# Patient Record
Sex: Female | Born: 1975 | Race: White | Hispanic: No | Marital: Married | Smoking: Former smoker
Health system: Southern US, Academic
[De-identification: ages and names within clinical notes are randomized; demographics above are authoritative.]

## PROBLEM LIST (undated history)

## (undated) DIAGNOSIS — E063 Autoimmune thyroiditis: Secondary | ICD-10-CM

## (undated) DIAGNOSIS — Z973 Presence of spectacles and contact lenses: Secondary | ICD-10-CM

## (undated) DIAGNOSIS — G35 Multiple sclerosis: Secondary | ICD-10-CM

## (undated) HISTORY — DX: Multiple sclerosis (CMS HCC): G35

## (undated) HISTORY — PX: HX WISDOM TEETH EXTRACTION: SHX21

## (undated) HISTORY — DX: Autoimmune thyroiditis: E06.3

---

## 2010-09-22 ENCOUNTER — Ambulatory Visit (INDEPENDENT_AMBULATORY_CARE_PROVIDER_SITE_OTHER): Payer: BC Managed Care – PPO | Admitting: Endocrinology, Diabetes, & Metabolism

## 2010-12-24 ENCOUNTER — Encounter (INDEPENDENT_AMBULATORY_CARE_PROVIDER_SITE_OTHER): Payer: BC Managed Care – PPO | Admitting: Endocrinology, Diabetes, & Metabolism

## 2012-01-05 ENCOUNTER — Other Ambulatory Visit (INDEPENDENT_AMBULATORY_CARE_PROVIDER_SITE_OTHER): Payer: Self-pay | Admitting: Endocrinology, Diabetes, & Metabolism

## 2012-01-06 MED ORDER — SYNTHROID 100 MCG TABLET
100.0000 ug | ORAL_TABLET | Freq: Every day | ORAL | Status: DC
Start: 2012-01-05 — End: 2013-02-15

## 2013-02-04 ENCOUNTER — Other Ambulatory Visit (INDEPENDENT_AMBULATORY_CARE_PROVIDER_SITE_OTHER): Payer: Self-pay | Admitting: Endocrinology, Diabetes, & Metabolism

## 2013-02-05 NOTE — Telephone Encounter (Signed)
Terri:    I haven't seen Kayte since May 2012 and feel that we at least need thyroid function studies that are more recent than those available. Has she had any tests done through her primary care physician? If so, we'll need copies before committing to a year long prescription. An alternative would be for her PCP to prescribe the medication if there is available data - otherwise, I would extend a 30 day prescription and obtain the appropriate studies. Let me know>      SRG

## 2013-02-15 ENCOUNTER — Other Ambulatory Visit (INDEPENDENT_AMBULATORY_CARE_PROVIDER_SITE_OTHER): Payer: Self-pay | Admitting: Endocrinology, Diabetes, & Metabolism

## 2013-02-15 MED ORDER — SYNTHROID 100 MCG TABLET
100.0000 ug | ORAL_TABLET | Freq: Every day | ORAL | Status: DC
Start: 2013-02-15 — End: 2013-03-17

## 2013-02-23 ENCOUNTER — Ambulatory Visit (INDEPENDENT_AMBULATORY_CARE_PROVIDER_SITE_OTHER): Payer: BC Managed Care – PPO | Admitting: Endocrinology, Diabetes, & Metabolism

## 2013-02-23 ENCOUNTER — Encounter (INDEPENDENT_AMBULATORY_CARE_PROVIDER_SITE_OTHER): Payer: Self-pay | Admitting: Endocrinology, Diabetes, & Metabolism

## 2013-02-23 VITALS — BP 118/76 | HR 70 | Ht 66.0 in | Wt 144.6 lb

## 2013-02-23 LAB — THYROID STIMULATING HORMONE (SENSITIVE TSH): TSH: 3.4 u[IU]/mL (ref 0.350–5.550)

## 2013-02-23 LAB — THYROXINE, FREE (FREE T4): THYROXINE, FREE (FREE T4): 1.2 ng/dL (ref 0.9–1.8)

## 2013-02-23 NOTE — Progress Notes (Signed)
1. 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 At that time, TPO antibodies were quite positive and Synthroid maintenance was reduced from 112 mcg to 100 mcg daily when TSH was reported as 0.172 uIU/ml with a free thyroxine of 1.1 ng/dl. There have been no additional studies since that time. Over the ensuing two years she has noted a gradual weight reduction of approximately 10 lbs but no signs of hyper- or hypo- thyroidism. 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     Examination today revealed thyroid enlargement without distinct nodularity. There was no tenderness or adenopathy. DTR's equal, +2 with normal relaxation phase. We'll check TSH and free thyroxine with likely maintenance of 100 mcg Synthroid daily. In addition, a thyroid ultrasound will be scheduled with Dr. Atilano Median on return in 6 months.    2. Multiple sclerosis, previously followed by Dr, Sharlett Iles:    When last seen, Lindsey Kirby was doing well on Nuvigil 150 mg daily per Dr. Jonny Ruiz but the prescription has since "lapsed" following Dr. Raphael Gibney departure for the [facility]. A referral to resume Neurologic with Dr. Alroy Bailiff was provided.

## 2013-03-17 ENCOUNTER — Other Ambulatory Visit (INDEPENDENT_AMBULATORY_CARE_PROVIDER_SITE_OTHER): Payer: Self-pay | Admitting: Endocrinology, Diabetes, & Metabolism

## 2013-04-26 ENCOUNTER — Other Ambulatory Visit (INDEPENDENT_AMBULATORY_CARE_PROVIDER_SITE_OTHER): Payer: Self-pay | Admitting: Endocrinology, Diabetes, & Metabolism

## 2013-08-24 ENCOUNTER — Encounter (INDEPENDENT_AMBULATORY_CARE_PROVIDER_SITE_OTHER): Payer: BC Managed Care – PPO | Admitting: Endocrinology, Diabetes, & Metabolism

## 2013-08-27 ENCOUNTER — Ambulatory Visit (INDEPENDENT_AMBULATORY_CARE_PROVIDER_SITE_OTHER): Payer: BC Managed Care – PPO | Admitting: Endocrinology, Diabetes, & Metabolism

## 2013-08-27 ENCOUNTER — Encounter (INDEPENDENT_AMBULATORY_CARE_PROVIDER_SITE_OTHER): Payer: Self-pay

## 2013-08-27 ENCOUNTER — Ambulatory Visit (INDEPENDENT_AMBULATORY_CARE_PROVIDER_SITE_OTHER): Payer: BC Managed Care – PPO

## 2013-08-27 ENCOUNTER — Encounter (INDEPENDENT_AMBULATORY_CARE_PROVIDER_SITE_OTHER): Payer: Self-pay | Admitting: Endocrinology, Diabetes, & Metabolism

## 2013-08-27 VITALS — BP 136/80 | HR 79 | Ht 66.0 in | Wt 154.2 lb

## 2013-08-27 DIAGNOSIS — E039 Hypothyroidism, unspecified: Secondary | ICD-10-CM

## 2013-08-27 DIAGNOSIS — E041 Nontoxic single thyroid nodule: Secondary | ICD-10-CM

## 2013-08-27 NOTE — Progress Notes (Signed)
1. 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 She continues maintenance on the reduced dosaage of Synthroid at 100 mcg daily. TPO antibodies were quite positive with established Hashimoto's thyroiditis confirmed. 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   Examination today revealed thyroid enlargement without distinct nodularity. There was no tenderness or adenopathy. DTR's equal, +2 with normal relaxation phase. We'll check TSH and free thyroxine with likely maintenance of 100 mcg Synthroid daily. In addition, a thyroid ultrasound obtained through Dr. Atilano Median was considered compatible with "Hashimoto's".   2. Multiple sclerosis, previously followed by Dr, Sharlett Iles:   When last seen, Luceille was doing well on Nuvigil 150 mg daily per Dr. Jonny Ruiz but the prescription has since "lapsed" following Dr. Raphael Gibney departure for the [facility]. A referral to resume Neurologic follow-up with Dr. Alroy Bailiff was provided previously.  3. Candida:  Kaycee is concerned re: systemic candidal infections as contributing persistent GI problems with upper abdominal pain and a recent normal colonoscopy. Her concerns can best be answered by an ID referral with several names provided for her PCP to consider.

## 2013-09-03 NOTE — Progress Notes (Unsigned)
[facility], Department of Medicine and Specialty Office  [address] [place]  [place], [place] [address]  [phone]    PROGRESS NOTE    PATIENT NAME: Lindsey Kirby, Lindsey Kirby  CHART NUMBER: [id number]  DATE OF BIRTH: 07-Jul-1976  DATE OF SERVICE: 08/27/2013    SUBJECTIVE:  This is a patient with positive TPO.  The patient's thyroid was imaged in the upper, mid and lower portion with transverse cuts.  Longitudinal cuts were made as well.  Right lobe is 1.29 x 1.49 x 4.53 for a volume of 4.55 cm3.  Left lobe is 1.38 x 1.06 x 3.46 x 4.11 for a volume of 3.14 cm3.  The isthmus is 0.372 cm.  The thyroid has a classic appearance of thyroiditis with patchy uptake.  No specific nodules are noted.    IMPRESSION:  Probable thyroiditis.      PLAN:  The patient should be reevaluated in one year.  Stored in [place].      Katherene Ponto, MD  Professor, Department of Internal Medicine  [facility] [place] [facility]    NO/MV/[id number]; D: 09/03/2013 13:59:10; T: 09/03/2013 17:00:35

## 2014-04-15 ENCOUNTER — Other Ambulatory Visit (INDEPENDENT_AMBULATORY_CARE_PROVIDER_SITE_OTHER): Payer: Self-pay | Admitting: Endocrinology, Diabetes, & Metabolism

## 2014-04-15 DIAGNOSIS — E039 Hypothyroidism, unspecified: Secondary | ICD-10-CM

## 2014-04-15 DIAGNOSIS — E034 Atrophy of thyroid (acquired): Secondary | ICD-10-CM

## 2014-04-15 MED ORDER — SYNTHROID 100 MCG TABLET
ORAL_TABLET | ORAL | Status: DC
Start: 2014-04-15 — End: 2015-04-21

## 2014-07-29 NOTE — Care Management Notes (Signed)
[facility] INTAKE    Insurance Information:  Referring Provider and Contact Number: Dr. Adelene Amas. Melvyn Neth,  [phone]  Transfer Source and Contact Number: [place],   [phone], [phone]  Transfer Emergent:   no    Date Admitted:  Diagnosis: MS  Type of bed pt currently in at sending facility: ER  PMH:  Past Medical History   Diagnosis Date    MS (multiple sclerosis)     Hashimoto's thyroiditis          Dialysis: no    Cancer: no     Isolation: no     Is patient established with family practice/attending? no  Reason for transfer:  For a higher level of care.    Patient story/clinical presentation: Hx of MS, vision is getting worse. Dr. Francisco Capuchin spoke with Dr. Melvyn Neth and accepted pt.    Current vital signs:  HR: 61   BP: 136/89   Resp: 18   Temp: 98      Sats:      O2:  Labs:    WBC (3.6-11)    HGB (13.1-17.3)    Hct (39.8-50.2)    PLC (140-400)    ABG ph    ABG PCO2    ABG PO2    ABG HCO3    Base def/exc    LP Glucose    LP Neutrophil    LP Protein    LP WBC    LP Other    PT    PTT    INR    CPK    Trop I    AST    ALT    Na (135-    K+ (3.5)    CL (96-1)    CO2 (23-35)    BUN (8-26)    Cr (0.62-1.27)    Glucose      Radiology (pelase place images on image grid)MRI on 07/19/14 - lesion in the cord, MS    EKG:  IV Access:  Medications, IVF, Drips:    Oxygen/Bipap/Vent settings:    Pt class and accommodation: inpatient,  floor  Service and accepting MD: Neurology 1,  Dr. Francisco Capuchin

## 2014-07-30 ENCOUNTER — Inpatient Hospital Stay
Admission: AD | Admit: 2014-07-30 | Discharge: 2014-08-01 | DRG: 059 | Disposition: A | Discharge status: Home / Self Care | Source: Other Acute Inpatient Hospital | Attending: Neurology | Admitting: Neurology

## 2014-07-30 ENCOUNTER — Encounter (HOSPITAL_COMMUNITY): Payer: Self-pay | Admitting: Neurology

## 2014-07-30 ENCOUNTER — Inpatient Hospital Stay (HOSPITAL_COMMUNITY)

## 2014-07-30 ENCOUNTER — Inpatient Hospital Stay (HOSPITAL_COMMUNITY): Payer: BC Managed Care – PPO | Admitting: Neurology

## 2014-07-30 DIAGNOSIS — E039 Hypothyroidism, unspecified: Secondary | ICD-10-CM | POA: Diagnosis present

## 2014-07-30 DIAGNOSIS — H469 Unspecified optic neuritis: Secondary | ICD-10-CM

## 2014-07-30 DIAGNOSIS — Z87891 Personal history of nicotine dependence: Secondary | ICD-10-CM

## 2014-07-30 DIAGNOSIS — G35 Multiple sclerosis: Secondary | ICD-10-CM

## 2014-07-30 DIAGNOSIS — E063 Autoimmune thyroiditis: Secondary | ICD-10-CM | POA: Diagnosis present

## 2014-07-30 DIAGNOSIS — H548 Legal blindness, as defined in USA: Secondary | ICD-10-CM | POA: Diagnosis present

## 2014-07-30 HISTORY — DX: Presence of spectacles and contact lenses: Z97.3

## 2014-07-30 LAB — CBC/DIFF
BASOPHILS: 0 %
BASOS ABS: 0.007 10*3/uL (ref 0.000–0.200)
EOS ABS: 0.007 10*3/uL (ref 0.000–0.500)
EOSINOPHIL: 0 %
HCT: 37.3 % (ref 33.5–45.2)
HGB: 12.4 g/dL (ref 11.2–15.2)
LYMPHOCYTES: 6 %
LYMPHS ABS: 0.834 10*3/uL — ABNORMAL LOW (ref 1.000–4.800)
MCH: 30.1 pg (ref 27.4–33.0)
MCHC: 33.1 g/dL (ref 32.5–35.8)
MCV: 91 fL (ref 78–100)
MONOCYTES: 6 %
MONOS ABS: 0.856 10*3/uL (ref 0.300–1.000)
MPV: 7.9 fL (ref 7.5–11.5)
PLATELET COUNT: 235 10*3/uL (ref 140–450)
PMN ABS: 11.973 10*3/uL — ABNORMAL HIGH (ref 1.500–7.700)
PMN'S: 88 %
RBC: 4.1 MIL/uL (ref 3.63–4.92)
RDW: 14.2 % (ref 12.0–15.0)
WBC: 13.7 10*3/uL — ABNORMAL HIGH (ref 3.5–11.0)

## 2014-07-30 LAB — BASIC METABOLIC PANEL
ANION GAP: 6 mmol/L (ref 4–13)
BUN/CREAT RATIO: 19 (ref 6–22)
BUN/CREAT RATIO: 19 (ref 6–22)
BUN: 14 mg/dL (ref 8–25)
CALCIUM: 8.4 mg/dL — ABNORMAL LOW (ref 8.5–10.4)
CARBON DIOXIDE: 26 mmol/L (ref 22–32)
CHLORIDE: 104 mmol/L (ref 96–111)
CREATININE: 0.72 mg/dL (ref 0.49–1.10)
ESTIMATED GLOMERULAR FILTRATION RATE: >59 ml/min/1.73m2 (ref >59)
GLUCOSE,NONFAST: 105 mg/dL (ref 65–139)
POTASSIUM: 4 mmol/L (ref 3.5–5.1)
SODIUM: 136 mmol/L (ref 136–145)

## 2014-07-30 LAB — PERFORM POC WHOLE BLOOD GLUCOSE
GLUCOSE, POINT OF CARE: 114 mg/dL — ABNORMAL HIGH (ref 70–105)
GLUCOSE, POINT OF CARE: 183 mg/dL — ABNORMAL HIGH (ref 70–105)
GLUCOSE, POINT OF CARE: 183 mg/dL — ABNORMAL HIGH (ref 70–105)
GLUCOSE, POINT OF CARE: 160 mg/dL — ABNORMAL HIGH (ref 70–105)

## 2014-07-30 LAB — URINALYSIS MACROSCOPIC WITH REFLEX TO MICROSCOPIC URINALYSIS (CULTURE NOT PERFORMED)
NITRITE: NEGATIVE
PH URINE: 7 (ref 5.0–8.0)
PROTEIN: 30 mg/dL — AB
SPECIFIC GRAVITY, URINE: <=1.005 (ref 1.005–1.030)
UROBILINOGEN: NEGATIVE mg/dL

## 2014-07-30 LAB — URINALYSIS, MICROSCOPIC
RBC'S: 38 /HPF — ABNORMAL HIGH (ref <6)
WBC'S: 6 /HPF (ref <11)

## 2014-07-30 LAB — PHOSPHORUS: PHOSPHORUS: 3.2 mg/dL (ref 2.4–4.7)

## 2014-07-30 LAB — URINALYSIS WITH MICROSCOPIC REFLEX IF INDICATED: LEUKOCYTES: NEGATIVE

## 2014-07-30 LAB — MAGNESIUM: MAGNESIUM: 2.4 mg/dL (ref 1.6–2.5)

## 2014-07-30 MED ORDER — ENOXAPARIN 40 MG/0.4 ML SUBCUTANEOUS SYRINGE
40.0000 mg | INJECTION | SUBCUTANEOUS | Status: DC
Start: 2014-07-30 — End: 2014-08-01
  Administered 2014-07-30 – 2014-07-31 (×2): 40 mg via SUBCUTANEOUS
  Administered 2014-08-01: 0 mg via SUBCUTANEOUS
  Filled 2014-07-30 (×2): qty 0
  Filled 2014-07-30: qty 0.4

## 2014-07-30 MED ORDER — LEVOTHYROXINE 100 MCG TABLET
100.00 ug | ORAL_TABLET | Freq: Every morning | ORAL | Status: DC
Start: 2014-07-30 — End: 2014-08-01
  Administered 2014-07-30 – 2014-08-01 (×3): 100 ug via ORAL
  Filled 2014-07-30 (×4): qty 1

## 2014-07-30 MED ORDER — GADOPENTETATE DIMEGLUMINE 10 MMOL/20 ML(469.01 MG/ML) INTRAVENOUS SOLN
12.00 mL | INTRAVENOUS | Status: AC
Start: 2014-07-30 — End: 2014-07-30
  Administered 2014-07-30: 06:00:00 12 mL via INTRAVENOUS

## 2014-07-30 MED ORDER — FOLIC ACID 1 MG TABLET
1.0000 mg | ORAL_TABLET | Freq: Every day | ORAL | Status: DC
Start: 2014-07-30 — End: 2014-08-01
  Administered 2014-07-30 – 2014-08-01 (×3): 1 mg via ORAL
  Filled 2014-07-30 (×3): qty 1

## 2014-07-30 MED ORDER — SODIUM CHLORIDE 0.9 % INTRAVENOUS SOLUTION
1000.00 mg | INTRAVENOUS | Status: AC
Start: 2014-07-31 — End: 2014-08-01
  Administered 2014-07-31: 1000 mg via INTRAVENOUS
  Administered 2014-07-31: 0 mg via INTRAVENOUS
  Administered 2014-08-01: 1000 mg via INTRAVENOUS
  Filled 2014-07-30 (×2): qty 25

## 2014-07-30 MED ORDER — INSULIN LISPRO 100 UNIT/ML SUB-Q SSIP
2.00 [IU] | INJECTION | Freq: Four times a day (QID) | SUBCUTANEOUS | Status: DC | PRN
Start: 2014-07-30 — End: 2014-08-01
  Administered 2014-07-30 (×2): 2 [IU] via SUBCUTANEOUS
  Filled 2014-07-30: qty 3

## 2014-07-30 MED ORDER — VITAMIN B COMPLEX TABLET
1.0000 | ORAL_TABLET | Freq: Every day | ORAL | Status: DC
Start: 2014-07-30 — End: 2014-08-01
  Administered 2014-07-30 – 2014-08-01 (×3): 1 via ORAL
  Filled 2014-07-30 (×3): qty 1

## 2014-07-30 MED ORDER — SODIUM CHLORIDE 0.9 % INTRAVENOUS SOLUTION
1000.00 mg | INTRAVENOUS | Status: DC
Start: 2014-07-30 — End: 2014-07-30
  Administered 2014-07-30: 1000 mg via INTRAVENOUS
  Filled 2014-07-30: qty 25

## 2014-07-30 MED ORDER — ASCORBIC ACID (VITAMIN C) 500 MG TABLET
1000.0000 mg | ORAL_TABLET | Freq: Every day | ORAL | Status: DC
Start: 2014-07-30 — End: 2014-08-01
  Administered 2014-07-30 – 2014-08-01 (×3): 1000 mg via ORAL
  Filled 2014-07-30 (×3): qty 2

## 2014-07-30 MED ORDER — FAMOTIDINE 20 MG TABLET
20.0000 mg | ORAL_TABLET | Freq: Two times a day (BID) | ORAL | Status: DC
Start: 2014-07-30 — End: 2014-08-01
  Administered 2014-07-30: 20 mg via ORAL
  Administered 2014-07-30: 0 mg via ORAL
  Administered 2014-07-30 – 2014-08-01 (×4): 20 mg via ORAL
  Filled 2014-07-30 (×7): qty 1

## 2014-07-30 MED ADMIN — erythromycin 5 mg/gram (0.5 %) eye ointment: ORAL | @ 08:00:00 | NDC 17478007031

## 2014-07-30 MED ADMIN — Medication: INTRAVENOUS | @ 02:00:00

## 2014-07-30 MED ADMIN — bacitracin zinc 500 unit/gram topical ointment: SUBCUTANEOUS | @ 21:00:00 | NDC 00168001135

## 2014-07-30 NOTE — H&P (Addendum)
[facility]   Neurology H&P      Lindsey Kirby, Lindsey Kirby, 38 y.o. female  Date of Admission:  07/30/2014  Date of Birth:  [birth year]    PCP: Lindsey Octave, MD    Information obtained from: patient and chart review   Chief Complaint:  Bilateral blurry vision, c/f multiple sclerosis exacerbation     Lindsey:VHQIO E Kirby is a 38 y.o., White female with PMH of Multiple Sclerosis and hypothyroidism who presents from outside facility due to bilateral blurry vision which has progressively become worse since end of August. Patient diagnosed in 2001 with multiple sclerosis in [place]. She presented at that time, with gait instability, headache, unilateral weakness/numbness(unsure what side). Imaging of brain and spine was done and revealed lesions. At that time she was started avonex which she took for 2 years. She has also been on capoxone, tysabri, and gileneya. In the past 10 years patient has only had a 5-6 multiple sclerosis exacerbation and has received IV steroids. Last exacerbation where she received steroids was 4 years. Patient reports blurry vision started in the right eye in the end of august. She was recently started to adderall at that time, and she thought it was just a s/e. But then she started to have blurry vision in left eye. So she weaned her self off the adderall. However, blurry vision didn't resolve and progressively becoming worse. Patient went to neurologist in December 4th, (only time she could get an appointment) who referred you optometrist. 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 The next day patient went to ER, patient was prescribed oral steroids for 4 days, which didn't help. Patient went back to ED, had imaging done (MRI brain and cervical spine) diagnosed with optic neuritis. Patient started on heavier dose of steroids. As the steroids were not helping she went back to the ED today, and was [facility]. Denied any unilateral numbness/weakness.  Denied diplopia.     Admission Source:  Home  Advance Directives:  None-Discussed  Hospice involvement prior to admission?  Not applicable    Location (of pain): Quality (character of pain) Severity (minimal, mild, severe, scale or 1-10) Duration (how long has pain/sx present) Timing (when does pain/sx occur)  Context (activity at/before onset) Modifying Factors (what makes pain/sx  Better/worse) Associate Sign/Sx (what accompanies main pain/sx)    Past Medical History   Diagnosis Date    MS (multiple sclerosis)     Hashimoto's thyroiditis          No past surgical history on file.      Medications Prior to Admission     Prescriptions    SYNTHROID 100 mcg Oral Tablet    TAKE 1 TABLET BY MOUTH EVERY DAY        No Known Allergies  History   Substance Use Topics    Smoking status: Former Smoker -- 0.50 packs/day for 12 years     Types: Cigarettes     Quit date: 02/24/2007    Smokeless tobacco: Not on file    Alcohol Use: Not on file     Family History   Problem Relation Age of Onset    Hypertension             ROS: Other than ROS in the HPI, all other systems were negative.    Exam:     General: acutely ill  HENT:Head atraumatic and normocephalic  Neck: No JVD or thyromegaly or lymphadenopathy  Carotids:Carotids normal without bruit  Lungs: Clear to auscultation bilaterally.   Cardiovascular: regular rate and rhythm  Abdomen: Soft, non-tender  Extremities: No cyanosis or edema  Ophthalomscopic: normal w/o hemorrhages, exudates, or papilledema  Mental status:  Level of Consciousness: alert  Orientations: Alert and oriented x 3  MemoryRegistration, Recall, and Following of commands is normal  AttentionsAttention and Concentration are normal  Knowledge: Good  Language: Normal  Speech: Normal  Cranial nerves:   CN2: decreased visual acuity bilateral R: 20/70 L 20/70   CN 3,4,6: EOMI, PERRLA  CN 5Facial sensation intact  CN 7Face symmetrical  CN 8: Hearing grossly intact  CN 9,10: Palate symmetric and gag normal  CN  11: Sternocleidomastoid and Trapezius have normal strength.  CN 12: Tongue normal with no fasiculations or deviation  Gait, Coordination, and Reflexes:   Gait: Normal  Coordination: Coordination is normal without tremor    Muscle tone: WNL  Muscle exam  Arm Right Left Leg Right Left   Deltoid 5/5 5/5 Iliopsoas 5/5 5/5   Biceps 5/5 5/5 Quads 5/5 5/5   Triceps 5/5 5/5 Hamstrings 5/5 5/5   Wrist Extension 5/5 5/5 Ankle Dorsi Flexion 5/5 5/5   Wrist Flexion 5/5 5/5 Ankle Plantar Flexion 5/5 5/5   Interossei 5/5 5/5 Ankle Eversion 5/5 5/5   APB 5/5 5/5 Ankle Inversion 5/5 5/5       Reflexes   RJ BJ TJ KJ AJ Plantars Hoffman's   Right 2+ 2+ 2+ 2+ 2+ Downgoing Not present   Left 2+ 2+ 2+ 2+ 2+ Downgoing Not present     Sensory: Sensory exam in the upper and lower extremities is normal  Normal color, texture and turgor without significant lesions or rashes  Diabetes Monitors:  Patient not a diabetic.    Labs:    Lab Results for Last 24 Hours:  No results found for any visits on 07/30/14 (from the past 24 hour(s)).    Review of reports and notes reveal:   Independent Interpretation of images or specimens:  MRI brain 07/19/14: possible enhancement of the left optic nerve. Small hyperintense white matter lesions within bilateral hemispheres, periventricular and subcortical  MRI cervical spine 07/19/14: hyperintense lesion within upper cervical spinal cord. (unchanged as per read from previous imaging  MRI thoracic spine 2014 read: No hyperintense lesions seen. No enhancing lesions seen     Assessment/Plan:  Active Hospital Problems    Diagnosis    Multiple sclerosis       Lindsey DrainSarah E Kirby is a 38 y.o., White female with PMH of Multiple Sclerosis and hypothyroidism who presents from outside facility due to bilateral blurry vision which has progressively become worse since end of August    Multiple Sclerosis, concern for optic neuritis  -Ordered MRI orbits w/wo contrast. Recent MRI brain done on 07/19/14  -Will start patient  on IV Solumedrol 1000 mg daily x 5 days  -SSI and PPI  -Neurochecks, Vitals  -PT/OT     Chronic Conditions  Hashimoto's thyroiditis: Continue synthroid     DNR Status this admission:  Full Code  Palliative/Supportive Care consulted?  no  Hospice Consulted?  Not applicable    Risk Factors:  Brain Compression:  no  Obstructive Hydocephalus:  no  Coma -Not applicable  TIA not applicable  Cerebral Edema:  no  Encephalopathy:  no  Encephalitis-Not applicable  Seizure-Not applicable   Respiratory Failure/Other-Not applicable  Coagulopathy Not applicable      DVT/PE Prophylaxis: Lovenox    Tory EmeraldAisha Qazi, MD 07/30/2014, 00:24  I saw and examined the patient.  I reviewed the resident's note.  I agree with the findings and plan of care as documented in the resident's note.  Any exceptions/additions are edited/noted.    Possible multiple sclerosis exacerbation, continue high dose IV methylprednisolone 1 gm for 3 days.   Needs outpatient multiple sclerosis management, current not any disease modifying agent.     Huel Cote, MD 07/30/2014, 12:15

## 2014-07-30 NOTE — Nurses Notes (Signed)
Patient arrived from [facility] transported by EMS. Patient's vision is blurry in both eyes but patient is able to ambulate in room independently. Vitals and full initial assessment per flowsheet. Pt oriented to hospital, unit, and room. All questions answered.

## 2014-07-30 NOTE — Care Plan (Signed)
Problem: General Plan of Care(Adult,OB)  Goal: Plan of Care Review(Adult,OB)  The patient and/or their representative will communicate an understanding of their plan of care   Outcome: Outcome Achieved Date Met:  07/30/14  Assessment:    OT evaluation complete. Patient tolerated Occupational Therapy Evaluation well. Ms. Hornik presents with visual changes. Anticipate patient will progress well, especially since patient has already adapted her lifestyle for visual changes. Provided education on OT purpose and plan of care and safety with ADLs and functional transfers/mobility. From OT perspective patient is functioning at baseline and once medically stable, patient is safe to d/c home with assistance as needed. There are no acute OT needs at this time; however, if patient would have change in status, please re-consult.   Goals:      No acute OT goals.      Discharge Needs:    Equipment Recommendations: None anticipated- Patient stated that she has a chair in her shower.  Further Treatment Recommendations: Home with assistance as needed  Plan:    D/C OT     The risks/benefits of therapy have been discussed with the patient/caregiver and he/she is in agreement with the established plan of care.   Therapist:    Darrick Penna, OTR/L 07/30/2014   Pager [phone]

## 2014-07-30 NOTE — Care Management Notes (Signed)
[facility] Management Initial Evaluation    Patient Name: Lindsey Kirby  Date of Birth: Aug 24, 1975  Sex: female  Date/Time of Admission: 07/30/2014 12:03 AM  Room/Bed: 970/A  Payor: OTHER FED AND STATE INST / Plan: OTHER FED AND ST INST / Product Type: Special Bill /   PCP: Darnelle Going, MD    Pharmacy Info:   Preferred Pharmacy     [id number] [place], [place]   Phone: [phone]: [phone]   Open 24 Hours?: No        Emergency Contact Info:   Extended Emergency Contact Information  Primary Emergency Contact: james Kluck  Address[address] [address] [phone] Work Phone: [phone] Mobile Phone: [phone] Relation: Husband  Secondary Emergency Contact: sherry Cimmino   [place]: [phone] Work Phone: [phone] Mobile Phone: [phone] Relation: Relative    History:   SCHRYSTINA NAFFis a 38y.o., female, admitted for MS    Height/Weight: 167.6 cm ('5\' 6"' ) / 65.7 kg (144 lb 13.5 oz)     LOS: 0 days   Admitting Diagnosis: Multiple sclerosis [G35]    Assessment:      07/30/14 1125   Assessment Details   Assessment Type Admission   Date of Care Management Update 07/30/14   Date of Next DCP Update 08/02/14   Care Management Plan   Discharge Planning Status initial meeting   Projected Discharge Date 08/01/14   Discharge Needs Assessment   Equipment Currently Used at Home none   Equipment Needed After Discharge none   Discharge Facility/Level Of Care Needs Home (Patient/Family Member/other)(code 1)   Transportation Available car;family or friend will provide   Referral Information   Admission Type inpatient   Address Verified verified-no changes   Arrived From home or self-care   Insurance Verified verified-no change   ADVANCE DIRECTIVES   !! Does the Patient have an  Advance Directive? No, Information Offered and Refused   Patient Requests Assistance in Having Advance Directive Notarized. N/A   Employment/Financial   Patient has Prescription Coverage?  Yes       Name of Insurance Coverage for Medications VA / BCBS   !!Financial Concerns none   Living Environment   Select an age group to open "lives with" row.  Adult   Lives With spouse;child(ren), dependent   !!Living Arrangements house   Able to Return to Prior Living Arrangements yes   Home Safety   Home Assessment: No Problems Identified   Home Accessibility no concerns     MSW met w/ pt at bedside for admission assessment. Pt was friendly and appropriate. Pt reports she lives at home w/ her husband and son. Pt reports no home health or DME. [place] will have transport home when stable for d/c.    Per MD, pt with PMH of Multiple Sclerosis and hypothyroidism who presents from outside facility due to bilateral blurry vision which has progressively become worse since end of August. Ordered MRI orbits w/wo contrast. Recent MRI brain done on 07/19/14. Will start patient on IV Solumedrol 1000 mg daily x 5 days. SSI  and PPI. Neurochecks, Vitals. PT/OT     Discharge Plan:  Home (Patient/Family Member/other) (code 1)  No needs anticipated. Home d/c when stable. The patient will continue to be evaluated for developing discharge needs.     Case Manager: Dian Situ, SW 07/30/2014, 11:33  Phone: [phone]

## 2014-07-30 NOTE — PT Evaluation (Signed)
[facility]  Rehabilitation Services  Physical Therapy Initial Evaluation    Patient Name: Lindsey DrainSarah E Kirby  Date of Birth: [birth year]  Height:  167.6 cm (5\' 6" )  Weight: 65.7 kg (144 lb 13.5 oz)  Room/Bed: 970/A  Payor: BLUE CROSS BLUE SHIELD / Plan: [place] BC/BS PPO / Product Type: PPO /      Date/Time of Admission: 07/30/2014 12:03 AM  Admitting Diagnosis:  Multiple sclerosis [G35]      HPI:     Lindsey Kirby is a 38 y.o. female with PMH of Multiple Sclerosis and hypothyroidism who presents from outside facility due to bilateral blurry vision which has progressively become worse since end of August. Patient diagnosed in 2001 with multiple sclerosis in [place]. She presented at that time, with gait instability, headache, unilateral weakness/numbness(unsure what side). Imaging of brain and spine was done and revealed lesions. At that time she was started avonex which she took for 2 years. She has also been on capoxone, tysabri, and gileneya. In the past 10 years patient has only had a 5-6 multiple sclerosis exacerbation and has received IV steroids. Last exacerbation where she received steroids was 4 years. Patient reports blurry vision started in the right eye in the end of august. She was recently started to adderall at that time, and she thought it was just a s/e. But then she started to have blurry vision in left eye. So she weaned her self off the adderall. However, blurry vision didn't resolve and progressively becoming worse. Patient went to neurologist in December 4th, (only time she could get an appointment) who referred you optometrist. 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 The next day patient went to ER, patient was prescribed oral steroids for 4 days, which didn't help. Patient went back to ED, had imaging done (MRI brain and cervical spine) diagnosed with optic neuritis. Patient started on heavier dose of steroids. As the steroids were not helping  she went back to the ED today, and was transferred to [facility]. Denied any unilateral numbness/weakness. Denied diplopia.     Admission Source: Home  Precautions: severe visual impairment     Past Medical History   Diagnosis Date    MS (multiple sclerosis)     Hashimoto's thyroiditis     Wears glasses      wears contacts         No past surgical history on file.       reports that she quit smoking about 7 years ago. Her smoking use included Cigarettes. She smoked 0.00 packs per day for 12 years. She does not have any smokeless tobacco history on file. She reports that she drinks alcohol. She reports that she does not use illicit drugs.  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   Living Environment   Lives With child(ren), dependent;spouse  (39 year old son at home plus a daughter away in college )   !!Living Arrangements house  (2 stories)  Home Accessibility no concerns;bed not on first floor   Functional Level Prior   Prior Functional Level Comment fully independent in home; uses a guide when she runs   Activity   Activity Type ambulated in hall;up ad lib   Activity Level of Assistance independent   Ambulation Distance (Feet) 250   Coping/Psychosocial Response Interventions   Plan Of Care Reviewed With patient       Objective:   The chart was reviewed and I discussed the patient with the nurse prior to intervention.   Cognition: Alert, Awake, Cooperative and Oriented Person, Place and Time. Provides detailed history of function .  Follows Directions: Complex  ROM:  WFL and symmetrical   Strength:  WFL and symmetrical  Coordination:  WFL BUE and  LE  Sensation:  Grossly intact by self report  Vision:  Wearing no corrective lenses, legally blind; can detect color and gross form, can read some large type. Worse ( "whites out") if overheated , as for example in response to vigorous exercise.  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 She has no indications for continued PT.    Rehab Potential: Good    Problem List: acute worseing of baseline visual impairment  Barriers to Discharge: None anticipated    Goals:   There is no indication for continued physical therapy follow up at this time. Therefore no Plan of Care and no treatment goals were established.    Discharge Needs:   Equipment Recommendations: None Anticipated   Further Treatment Recommendations: No Treatment Indicated    Plan:   Current Intervention:No Intervention Needed, D/C from PT     The risks/benefits of therapy have been discussed with the patient/caregiver and he/she is in agreement with the established plan of care.     Therapist:   Randa Lynn PT   Pager [phone]  Evaluation Time: 30 minutes  Time may include review of medical chart, obtaining patient's functional history from patient/family/medical staff/case management/ancillary personnel, collaboration on findings and treatment options (with the above mentioned individuals), re-assessment, and acute care rehabilitation.

## 2014-07-30 NOTE — OT Evaluation (Signed)
[facility]  Occupational Therapy Initial Evaluation    Patient Name: Lindsey Kirby  Date of Birth: 1976/04/26  Height:  167.6 cm (5' 6")  Weight:  65.7 kg (144 lb 13.5 oz)  Room/Bed: 970/A  Payor: BLUE CROSS BLUE SHIELD / Plan: [place] PPO / Product Type: PPO /     Date/Time of Admission: 07/30/2014 12:03 AM  Admitting Diagnosis:  Multiple sclerosis [G35]      HPI:     Lindsey Kirby is a  38 y.o. female with PMH of Multiple Sclerosis and hypothyroidism who presents from outside facility due to bilateral blurry vision which has progressively become worse since end of August. OT is consulted for evaluation, treatment, and assessment of home safety/post discharge rehabilitation need.    Precautions: Standard  Past Medical History   Diagnosis Date    MS (multiple sclerosis)     Hashimoto's thyroiditis     Wears glasses      wears contacts         No past surgical history on file.      History     Social History Narrative       Subjective:   Flowsheet Information:     07/30/14 0903   Therapist Pager   OT Assigned/ Pager # [phone]   Mutuality/Individual Preferences   !!Patient Specific Preferences Patient likes to drink a lot of water   !!Patient Specific Goals To get treatment for vision changes   !!Patient Specific Interventions OT evaluation completed   !!What Anxieties, Fears or Concerns Do You Have About Your Health or Care? None reported   !!What Questions Do You Have About Your Health or Care? None reported   !!What Information Would Help [place] Give You More Personalized Care? 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 son and has daughter away at college)   !!Living Arrangements house  (2 level)   Home Assessment: Stairs in [place]  Accessibility bed and bath on same level;bed not on first floor;bath not on first floor;stairs to enter home;stairs within home   Number of Stairs to [place] 1   Number of Stairs Within [place] Patient stated that she has a walk-in shower with a shower chair   Functional Level Prior   Ambulation 0-->independent   Transferring 0-->independent   Toileting 0-->independent   Bathing 0-->independent   Dressing 0-->independent   Eating 0-->independent   Communication 0-->understands/communicates without difficulty   Swallowing 0-->swallows foods/liquids without difficulty   Prior Functional Level Comment Patient stated that she is fully independent at home and has made lifestyle modifications for MS and vision. Patient stated that she has a guide when running.   Self-Care   Usual Activity Tolerance excellent   Current Activity Tolerance good   Important Activities hobbies;exercise;family   Regular Exercise yes   Exercise Type running/jogging   Pain Assessment   Pretreatment Rating (Numbers Scale) 0/10 - no pain   Posttreatment Rating (Numbers Scale) 0/10 - no pain   Cognitive/Neuro/Behavioral   Cognitive/Neuro/Behavioral WDL WDL   Coping/Psychosocial Response   Observed Emotional State calm;cooperative;pleasant   Verbalized Emotional State hopefulness   Coping/Psychosocial Response Interventions   Plan Of Care Reviewed With patient  Pain:  Pre-Treatment Pain: 0  Post-Treatment Pain: 0  Interventions necessary and outcome:None required  Patient is confused/non-verbal and unable to verbalize response to the above information: No    Patient is alert and cooperative.  Patient stated that she is performing functionally at her baseline. Patient stated that her only barrier is her vision changes; however, patient stated that she has been able to adapt her ADLs and IADLs to compensate for it.    Goals: To get treatment for vision changes    Objective:   Patient seen this AM with professional  assist of PT. RN approved session. Patient supine in bed upon arrival. Patient agreeable to OT. Patient transferred from supine in bed to EOB and EOB to standing with Independence. Patient walked around room, gathered her slippers, and donned slippers with Independence. Patient walked household distance unsupported with Independence. Patient doffed slippers, put slippers away, and transferred back into bed with Independence. Post OT session, patient supine in bed; call bell within reach and all expressed needs met.   Precautions: Standard  Cognition and Perception:  Cognition: alert and cooperative   Orientation: person, place, time and situation   Attention: WNL   Follows Commands: simple and complex   Short Term Memory: WNL   Long Term Memory: WNL   Problem Solving: WNL   Follow Through: WNL   Initiation: WNL   Safety: WNL     Vision   Visual Tracking: fluent   Visual Correction: None   Vision Neglect: None   Hemianopsia: None   Patient stated that her vision was "fuzzy", but that she has adapted her screens on her tablet and phone to adapt to it. Patient stated that she is still able to read, look at her tablet, and watch tv. Patient did explain that she has had trouble seeing after increasing her core temperature by exercise or stress, but patient stated that this has been happening for a long time and she has adapted to it. Patient also declined any double vision.    Upper Extremity  Tone: Normal  Fine Motor Control: WNL  Gross Motor Control: WNL  Ataxia: no  Spontaneous Movement: yes  ROM: L UE: WNL   R UE: WNL  Strength: BUE strength 5/5    Sensory  Light Touch: NT   Right/Left Discrimination: NT   Proprioception:  NT   Patient stated that she has no issues with sensation in her fingers/hand.    Activities of Daily Living (ADL's) and Functional Assessment  Feeding: not able to assess   Grooming: not able to assess   Bathing: Upper Extremity - not able to assess    Lower Extremity - not able to assess      Dressing: Upper Extremity - not able to assess    Lower Extremity - independent   Toileting: not able to assess   Toilet Transfer: NT  Tub/Shower Transfer: NT  Bed Mobility: independent  Functional Transfers: supine to sit independent, sit to supine independent and sit to stand independent  Functional Mobility: independent   Balance: Sitting static5 (Normal)/dynamic 5 (Normal); Standing static 5 (Normal)/dynamic 5 (Normal)  Performance of Functional Activity: Patient walked household distance unsupported with Independence.    Instrumental Activities of Daily Living:  Not formally assessed. Patient stated that she has no issues with completing meal preparation tasks, housekeeping tasks, her gardening tasks, or her exercise routines.    Patient/Family Education:   OT purpose and plan of care. Safety during ADLs and functional transfers/mobility.  Patient verbalized understanding on all education information and recommendations.     Assessment:   OT evaluation complete. Patient tolerated Occupational Therapy Evaluation well. Ms. Rauda presents with visual changes. Anticipate patient will progress well, especially since patient has already adapted her lifestyle for visual changes. Provided education on OT purpose and plan of care and safety with ADLs and functional transfers/mobility. From OT perspective patient is functioning at baseline and once medically stable, patient is safe to d/c home with assistance as needed. There are no acute OT needs at this time; however, if patient would have change in status, please re-consult.     Goals:     No acute OT goals.      Discharge Needs:   Equipment Recommendations: None anticipated- Patient stated that she has a chair in her shower.  Further Treatment Recommendations: Home with assistance as needed    Plan:   D/C OT     The risks/benefits of therapy have been discussed with the patient/caregiver and he/she is in agreement with the established plan of care.      Therapist:   Darrick Penna, OTR/L 07/30/2014   Pager [phone]  Treatment Time: 30 minutes  Time may include review of medical chart, obtaining patient's functional history from patient/family/medical staff/case management/ancillary personnel, collaboration on findings and treatment options (with the above mentioned individuals), re-assessment, and acute care rehabilitation.

## 2014-07-30 NOTE — Care Plan (Addendum)
Problem: General Plan of Care(Adult,OB)  Goal: Plan of Care Review(Adult,OB)  The patient and/or their representative will communicate an understanding of their plan of care   Outcome: Ongoing (see interventions/notes)  Patient admitted for testing and IV Solumedrol treatment. Admission and MRI completed. Solumedrol initiated.     Problem: Sensory/Perceptual Alteration (Adult)  Goal: Identify Related Risk Factors and Signs and Symptoms  Related risk factors and signs and symptoms are identified upon initiation of Human Response Clinical Practice Guideline (CPG)   Outcome: Ongoing (see interventions/notes)  Goal: Functional Ability/Safety  Patient will demonstrate the desired outcomes by discharge/transition of care.   Outcome: Ongoing (see interventions/notes)  Goal: Balanced Response to Sensory Input  Patient will demonstrate the desired outcomes by discharge/transition of care.   Outcome: Ongoing (see interventions/notes)

## 2014-07-30 NOTE — Nurses Notes (Signed)
Possible sepsis alert per Epic for WBC of 13.7 and HR 108. HR recheck 67. Neurology 1 service informed via text page.

## 2014-07-30 NOTE — Ancillary Notes (Signed)
[facility] [employer]  MRI Technologist Note        MRI has been completed.        Saverio Danker, RTR 07/30/2014, 06:24

## 2014-07-30 NOTE — Care Plan (Signed)
Problem: General Plan of Care(Adult,OB)  Goal: Plan of Care Review(Adult,OB)  The patient and/or their representative will communicate an understanding of their plan of care   Outcome: Ongoing (see interventions/notes)  Blurry vision continues. Steroid treatment continues. MRI completed today.    Problem: Sensory/Perceptual Alteration (Adult)  Goal: Identify Related Risk Factors and Signs and Symptoms  Related risk factors and signs and symptoms are identified upon initiation of Human Response Clinical Practice Guideline (CPG)   Outcome: Ongoing (see interventions/notes)  Goal: Functional Ability/Safety  Patient will demonstrate the desired outcomes by discharge/transition of care.   Outcome: Ongoing (see interventions/notes)  Goal: Balanced Response to Sensory Input  Patient will demonstrate the desired outcomes by discharge/transition of care.   Outcome: Ongoing (see interventions/notes)    Problem: Multiple Sclerosis (Adult,Obstetrics,Pediatric)  Prevent and manage potential problems including:1. pain2. chronic pain3. cognitive impairment4. depression5. eating/swallowing impairment6. embolism7. fatigue8. functional decline/self-care deficit9. infection10. MS exacerbation11. situational response12. skin breakdown13. spasticity14. speech impairment15. tremor/ataxia16. bladder/bowel dysfunction  Goal: Signs and Symptoms of Listed Potential Problems Will be Absent or Manageable (Multiple Sclerosis)  Signs and symptoms of listed potential problems will be absent or manageable by discharge/transition of care (reference Multiple Sclerosis (Adult,Obstetrics,Pediatric) CPG).  Outcome: Ongoing (see interventions/notes)

## 2014-07-30 NOTE — Care Plan (Signed)
Problem: General Plan of Care(Adult,OB)  Goal: Plan of Care Review(Adult,OB)  The patient and/or their representative will communicate an understanding of their plan of care   Outcome: Ongoing (see interventions/notes)  MSW met w/ pt at bedside for admission assessment. Pt was friendly and appropriate. Pt reports she lives at home w/ her husband and son. Pt reports no home health or DME. Pt has insurance and Rx coverage through [place] and [place]. Pt will have transport home when stable for d/c.    Discharge Plan:  Home (Patient/Family Member/other) (code 1)  No needs anticipated. Home d/c when stable. The patient will continue to be evaluated for developing discharge needs.     Case Manager: Dian Situ, SW 07/30/2014, 11:33  Phone: [phone]

## 2014-07-31 DIAGNOSIS — H538 Other visual disturbances: Secondary | ICD-10-CM

## 2014-07-31 DIAGNOSIS — E039 Hypothyroidism, unspecified: Secondary | ICD-10-CM

## 2014-07-31 LAB — PERFORM POC WHOLE BLOOD GLUCOSE
GLUCOSE, POINT OF CARE: 116 mg/dL — ABNORMAL HIGH (ref 70–105)
GLUCOSE, POINT OF CARE: 192 mg/dL — ABNORMAL HIGH (ref 70–105)
GLUCOSE, POINT OF CARE: 112 mg/dL — ABNORMAL HIGH (ref 70–105)
GLUCOSE, POINT OF CARE: 102 mg/dL (ref 70–105)

## 2014-07-31 NOTE — Progress Notes (Addendum)
[facility]  Neurology Progress Note      Barney Drainaraczkozy,Jaide E, 38 y.o. female  Date of Admission:  07/30/2014  Date of service: 07/31/2014  Date of Birth:  [birth year]      Chief Complaint: blurry vision  Pt's condition today: improving    Subjective: no acute events overnight.  Pt reports improvement with color vision.  She also states that it is not as affected when she is hot or drinking hot things.  Pt tolerating steroids well.  Denies HA, SOB, chest pain, abd pain, weakness, numbness/tingling.    Vital Signs:  Filed Vitals:    07/30/14 2030 07/30/14 2207 07/31/14 0103 07/31/14 0543   BP:  109/61 96/47 95/58    Pulse:  87 76 78   Temp:  37.1 C (98.7 F) 36.7 C (98.1 F) 36.8 C (98.2 F)   Resp:  16 18 18    SpO2: 97% 99%       Today's Physical Exam:  General:alert  Mental status:Alert and oriented x 3  Memory: Registration, Recall, and Following of commands is normal  Attention: Attention and Concentration are normal  Knowledge: Good  Language and Speech: Normal and Normal  Cranial nerves: blurry vision, visual fields intact with full ROM, remaining CN intact  Muscle tone: WNL  Motor strength:  Motor strength is normal throughout.  Sensory: Sensory exam in the upper and lower extremities is normal  Gait: Normal  Coordination: Coordination is normal without tremor  Reflexes: Reflexes are 2/2 throughout    Current Medications:    Current Facility-Administered Medications:  ascorbic acid (VITAMIN C) tablet 1,000 mg Oral Daily   enoxaparin PF (LOVENOX) 40 mg/0.4 mL SubQ injection 40 mg Subcutaneous Q24H   famotidine (PEPCID) tablet 20 mg Oral 2x/day   folic acid (FOLVITE) tablet 1 mg Oral Daily   levothyroxine (SYNTHROID) tablet 100 mcg Oral QAM   methylPREDNISolone (SOLU-MEDROL) 1,000 mg in NS 50 mL IVPB 1,000 mg Intravenous Q24H   SSIP insulin lispro (HUMALOG) 100 units/mL injection 2-6 Units Subcutaneous 4x/day PRN   vitamin B complex tablet 1 Tab Oral Daily       I/O:  I/O last 24 hours:       Intake/Output Summary (Last 24 hours) at 07/31/14 0700  Last data filed at 07/31/14 0544   Gross per 24 hour   Intake    600 ml   Output      0 ml   Net    600 ml     I/O current shift:       Labs  Please indicate ordered or reviewed)  Reviewed:   I have reviewed all lab results.  Lab Results for Last 24 Hours:    Results for orders placed or performed during the hospital encounter of 07/30/14 (from the past 24 hour(s))   POCT WHOLE BLOOD GLUCOSE   Result Value Ref Range    GLUCOSE, POINT OF CARE 140 (H) 70 - 105 mg/dL   POCT WHOLE BLOOD GLUCOSE   Result Value Ref Range    GLUCOSE, POINT OF CARE 183 (H) 70 - 105 mg/dL   POCT WHOLE BLOOD GLUCOSE   Result Value Ref Range    GLUCOSE, POINT OF CARE 160 (H) 70 - 105 mg/dL   POCT WHOLE BLOOD GLUCOSE   Result Value Ref Range    GLUCOSE, POINT OF CARE 116 (H) 70 - 105 mg/dL       Review of reports and notes reveal:     Independent Interpretation of  images or specimens:  MRI orbits w/wo contrast 07-30-14: subtle enhancement seen in the chiasm extending into the prechiasmatic optic nerves, again more prominent on the left. The appearances compatible with optic neuritis although infiltrative neoplasm cannot exclude on the basis of imaging.     Patient/ Family Discussion: steroid course, MRI orbits results    Assessment/Plan:  Active Hospital Problems    Diagnosis    Primary Problem: Multiple sclerosis     Labs/Diagnostic studies ordered: none    38 yo woman admitted for 4 mo of bilateral blurry vision.  History of MS and was on Tysabri which was stopped due to financial and insurance issues.  Currently not on disease modifying agent.  Improving on high dose steroids.    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  I reviewed the resident's note.  I agree with the findings and plan of care as documented in the resident's note.  Any exceptions/additions are edited/noted.    Vision back to baseline. Will complete three days of IVSM, discharge in AM after completing infusion.   Will start disease modifying agent as outpatient.     Huel Cote, MD 07/31/2014, 11:50

## 2014-07-31 NOTE — Care Plan (Signed)
Problem: General Plan of Care(Adult,OB)  Goal: Plan of Care Review(Adult,OB)  The patient and/or their representative will communicate an understanding of their plan of care   Outcome: Ongoing (see interventions/notes)  VSS overnight, no complaints of pain. Patient states that her vision is still blurry. Patient received dose 2/3 IV solumedrol this morning.     Problem: Sensory/Perceptual Alteration (Adult)  Goal: Identify Related Risk Factors and Signs and Symptoms  Related risk factors and signs and symptoms are identified upon initiation of Human Response Clinical Practice Guideline (CPG)   Outcome: Ongoing (see interventions/notes)  Goal: Functional Ability/Safety  Patient will demonstrate the desired outcomes by discharge/transition of care.   Outcome: Ongoing (see interventions/notes)  Goal: Balanced Response to Sensory Input  Patient will demonstrate the desired outcomes by discharge/transition of care.   Outcome: Ongoing (see interventions/notes)    Problem: Multiple Sclerosis (Adult,Obstetrics,Pediatric)  Prevent and manage potential problems including:1. pain2. chronic pain3. cognitive impairment4. depression5. eating/swallowing impairment6. embolism7. fatigue8. functional decline/self-care deficit9. infection10. MS exacerbation11. situational response12. skin breakdown13. spasticity14. speech impairment15. tremor/ataxia16. bladder/bowel dysfunction   Goal: Signs and Symptoms of Listed Potential Problems Will be Absent or Manageable (Multiple Sclerosis)  Signs and symptoms of listed potential problems will be absent or manageable by discharge/transition of care (reference Multiple Sclerosis (Adult,Obstetrics,Pediatric) CPG).   Outcome: Ongoing (see interventions/notes)

## 2014-07-31 NOTE — Care Plan (Signed)
Problem: General Plan of Care(Adult,OB)  Goal: Individualization/Patient Specific Goal(Adult/OB)  1. Please make sure patient has lots of ice water.   2. Patient has moderately blurry vision on admission but is able to walk on own in room.   3. Please keep room cooler. Pts vision is more blurry when she is hot.   4. Patient wears extended use contact lenses   Outcome: Ongoing (see interventions/notes)  States feels much better OOB in room remade own bed refused assistance stable gait no c/o  Goal: Plan of Care Review(Adult,OB)  The patient and/or their representative will communicate an understanding of their plan of care   Outcome: Ongoing (see interventions/notes)    Problem: Sensory/Perceptual Alteration (Adult)  Goal: Identify Related Risk Factors and Signs and Symptoms  Related risk factors and signs and symptoms are identified upon initiation of Human Response Clinical Practice Guideline (CPG)   Outcome: Ongoing (see interventions/notes)  Goal: Functional Ability/Safety  Patient will demonstrate the desired outcomes by discharge/transition of care.   Outcome: Ongoing (see interventions/notes)  Goal: Balanced Response to Sensory Input  Patient will demonstrate the desired outcomes by discharge/transition of care.   Outcome: Ongoing (see interventions/notes)    Problem: Multiple Sclerosis (Adult,Obstetrics,Pediatric)  Prevent and manage potential problems including:1. pain2. chronic pain3. cognitive impairment4. depression5. eating/swallowing impairment6. embolism7. fatigue8. functional decline/self-care deficit9. infection10. MS exacerbation11. situational response12. skin breakdown13. spasticity14. speech impairment15. tremor/ataxia16. bladder/bowel dysfunction   Goal: Signs and Symptoms of Listed Potential Problems Will be Absent or Manageable (Multiple Sclerosis)  Signs and symptoms of listed potential problems will be absent or manageable by discharge/transition of care (reference Multiple Sclerosis  (Adult,Obstetrics,Pediatric) CPG).   Outcome: Ongoing (see interventions/notes)

## 2014-07-31 NOTE — Discharge Summary (Addendum)
DISCHARGE SUMMARY      PATIENT NAME:  Barney Drainaraczkozy,Jaynee E  MRN:  [id number]  DOB:  08/16/1975    ADMISSION DATE:  07/30/2014  DISCHARGE DATE:  08/01/2014    ATTENDING PHYSICIAN: No att. providers found  PRIMARY CARE PHYSICIAN: Lezlie OctaveAmy W Dowdy, MD     DISCHARGE DIAGNOSIS:   Principle Problem: Multiple sclerosis  Active Hospital Problems    Diagnosis Date Noted    Principle Problem: Multiple sclerosis 02/23/2013      Resolved Hospital Problems    Diagnosis    No resolved problems to display.     Active Non-Hospital Problems    Diagnosis Date Noted    Thyroid nodule 02/23/2013    Hypothyroid 01/06/2012      No Known Allergies  DISCHARGE MEDICATIONS:     Current Discharge Medication List      CONTINUE these medications - NO CHANGES were made during your visit.       Details    ADVIL PM ORAL    Oral   Refills:  0       Ascorbic Acid 1,000 mg Tablet   Commonly known as:  VITAMIN C    1,000 mg, Oral, DAILY   Refills:  0       cholecalciferol (vitamin D3) 1,000 unit Tablet    10,000 Units, Oral, DAILY   Refills:  0       folic acid 1 mg Tablet   Commonly known as:  FOLVITE    1 mg, Oral, DAILY   Refills:  0       SYNTHROID 100 mcg Tablet   Generic drug:  levothyroxine    TAKE 1 TABLET BY MOUTH EVERY DAY   Qty:  30 Tab   Refills:  11       vitamin B complex Tablet    1 Tab, Oral, DAILY   Refills:  0           DISCHARGE INSTRUCTIONS:    No discharge procedures on file.     REASON FOR HOSPITALIZATION AND HOSPITAL COURSE:  This is a 38 y.o. woman admitted on 12-29 for 4 months of bilateral blurry vision likely from MS exacerbation.  Symptoms worsen with heat.  Pt was diagnosed with MS in 2001 and was well managed on Tysabri for a period.  It was stopped due to financial and insurance issues.  She has also tried Avonex, Copaxone, and Gileneya.  Pt was started on high dose steroids for 3 days with some improvement in her symptoms.  MRI orbits w/wo contrast showed enlargement/edema of optic chiasm extending to optic nerves, worse on  left, compatible with optic neuritis.  NMO cell binding assay was sent, pending results at time of discharge.  She was stable for discharge on 08-01-14.  Pt reports [facility] and info in the system is not uptodate (pt no longer seeing Dr. Freddi Starrowdy).  Attempted to contact [facility] but unable to get new info.  She has neurology follow up with Dr. Guadalupe MapleJavier Gonzalez in 8 weeks.  Pt instructed to contact [facility] for [facility] follow up in 1-2 weeks.    Labs pending at time of Discharge: NMO cell binding assay    Physical Exam at time of Discharge:  General:alert  Mental status:Alert and oriented x 3  Memory: Registration, Recall, and Following of commands is normal  Attention: Attention and Concentration are normal  Knowledge: Good  Language and Speech: Normal and Normal  Cranial nerves: blurry  vision, visual fields intact with full ROM, remaining CN intact  Muscle tone: WNL  Motor strength: Motor strength is normal throughout.  Sensory: Sensory exam in the upper and lower extremities is normal  Gait: Normal  Coordination: Coordination is normal without tremor  Reflexes: Reflexes are 2/2 throughout    CONDITION ON DISCHARGE:  A. Ambulation: Full ambulation  B. Self-care Ability: Complete  C. Cognitive Status Alert and Oriented x 3  D. DNR status at discharge: Full Code    DISCHARGE DISPOSITION:  Home discharge      NEUROLOGY RISK FACTORS:  -None of the following conditions apply        Elwin Mocha, MD              Copies sent to Care Team       Relationship Specialty Notifications Start End    Lezlie Octave, MD PCP - General EXTERNAL  09/22/10     Phone: [phone] Fax: [phone]         [address] [place] [address]          Referring providers can utilize [email or web address] to access their referred [employer] patient's information.

## 2014-08-01 LAB — PERFORM POC WHOLE BLOOD GLUCOSE: GLUCOSE, POINT OF CARE: 119 mg/dL — ABNORMAL HIGH (ref 70–105)

## 2014-08-01 LAB — NMO/AQP4 FACS, S: NMO/AQP4-IGG CBA: NEGATIVE

## 2014-08-01 NOTE — Progress Notes (Addendum)
[facility]  Neurology Progress Note      Lindsey Kirby, Lindsey Kirby, 38 y.o. female  Date of Admission:  07/30/2014  Date of service: 08/01/2014  Date of Birth:  [birth year]      Chief Complaint: blurry vision  Pt's condition today: improving    Subjective: no acute events overnight.  Pt reports her vision being about the same.  She would have moments when the color vision would transiently improve.  No issues otherwise.  Pt feels achy as she has not been exercising.  Denies HA, SOB, chest pain, numbness/tingling, weakness.    Vital Signs:  Filed Vitals:    07/31/14 2158 08/01/14 0145 08/01/14 0527 08/01/14 0541   BP: 105/70 105/71  105/71   Pulse: 66 66  66   Temp: 36.9 C (98.4 F) 36.7 C (98.1 F)  36.9 C (98.4 F)   Resp: 19 17  19    SpO2:   97%      Today's Physical Exam:  General:alert  Mental status:Alert and oriented x 3  Memory: Registration, Recall, and Following of commands is normal  Attention: Attention and Concentration are normal  Knowledge: Good  Language and Speech: Normal and Normal  Cranial nerves: blurry vision, visual fields intact with full ROM, remaining CN intact  Muscle tone: WNL  Motor strength:  Motor strength is normal throughout.  Sensory: Sensory exam in the upper and lower extremities is normal  Gait: Normal  Coordination: Coordination is normal without tremor  Reflexes: Reflexes are 2/2 throughout    Current Medications:    Current Facility-Administered Medications:  ascorbic acid (VITAMIN C) tablet 1,000 mg Oral Daily   enoxaparin PF (LOVENOX) 40 mg/0.4 mL SubQ injection 40 mg Subcutaneous Q24H   famotidine (PEPCID) tablet 20 mg Oral 2x/day   folic acid (FOLVITE) tablet 1 mg Oral Daily   levothyroxine (SYNTHROID) tablet 100 mcg Oral QAM   SSIP insulin lispro (HUMALOG) 100 units/mL injection 2-6 Units Subcutaneous 4x/day PRN   vitamin B complex tablet 1 Tab Oral Daily       I/O:  I/O last 24 hours:      Intake/Output Summary (Last 24 hours) at 08/01/14 0650  Last data filed at  07/31/14 1700   Gross per 24 hour   Intake    120 ml   Output      0 ml   Net    120 ml     I/O current shift:       Labs  Please indicate ordered or reviewed)  Reviewed:   I have reviewed all lab results.  Lab Results for Last 24 Hours:    Results for orders placed or performed during the hospital encounter of 07/30/14 (from the past 24 hour(s))   POCT WHOLE BLOOD GLUCOSE   Result Value Ref Range    GLUCOSE, POINT OF CARE 192 (H) 70 - 105 mg/dL   POCT WHOLE BLOOD GLUCOSE   Result Value Ref Range    GLUCOSE, POINT OF CARE 112 (H) 70 - 105 mg/dL   POCT WHOLE BLOOD GLUCOSE   Result Value Ref Range    GLUCOSE, POINT OF CARE 102 70 - 105 mg/dL   POCT WHOLE BLOOD GLUCOSE   Result Value Ref Range    GLUCOSE, POINT OF CARE 119 (H) 70 - 105 mg/dL       Review of reports and notes reveal:     Independent Interpretation of images or specimens:  MRI orbits w/wo contrast 07-30-14: subtle enhancement seen in  the chiasm extending into the prechiasmatic optic nerves, again more prominent on the left. The appearances compatible with optic neuritis although infiltrative neoplasm cannot exclude on the basis of imaging.     Patient/ Family Discussion: steroid course, MRI orbits results    Assessment/Plan:  Active Hospital Problems    Diagnosis    Primary Problem: Multiple sclerosis     Labs/Diagnostic studies ordered: none    38 yo woman admitted for 4 mo of bilateral blurry vision.  History of MS and was on Tysabri which was stopped due to financial and insurance issues.  Currently not on disease modifying agent.  Improving on high dose steroids.    Bilateral blurry vision secondary to MS exacerbation, improved  - MRI orbits - abnormal enlargement/edema in optic chiasm, compatible with optic neuritis  - NMO cell-binding assay in process  - finished 3 days of IV Solumedrol  - insulin sliding scale, GI prophylaxis  - neuro and vital checks  - PT/OT - no treatment indicated    Hypothyroidism  - levothyroxine 100 mcg daily (home  med)    Disposition  - plan for discharge home today  - pt needs neurology follow up [with plan to restart Tysabri], however [facility]    DVT/PE Prophylaxis: Lovenox    Walker KehrQuynh Vo, MD 08/01/2014, 06:50        I saw and examined the patient.  I reviewed the resident's note.  I agree with the findings and plan of care as documented in the resident's note.  Any exceptions/additions are edited/noted.    Completed IVSM 3/3 with some improvement in visual acuity and color vision.   F/u with Dr. Jayme CloudGonzalez.   Awaiting NMO Ab.    Huel CoteMuhammad Sameul Tagle, MD 08/01/2014, 13:53

## 2014-08-01 NOTE — Nurses Notes (Signed)
Patient discharged ambulatory to home with husband and child.

## 2014-08-01 NOTE — Nurses Notes (Signed)
Discharge instructions (verbal and written) and home medication list given to patient and reviewed. She verbalized a correct understanding of follow up and discharge medications. Patient instructed to keep dressing covering D/C'ed IV site until she arrived home. Also is any bleeding observed on dressing to apply direct pressure as she observed nurse doing. Assistive device (contacts and eyeglasses).

## 2014-08-26 ENCOUNTER — Encounter (INDEPENDENT_AMBULATORY_CARE_PROVIDER_SITE_OTHER): Payer: BC Managed Care – PPO | Admitting: Endocrinology, Diabetes, & Metabolism

## 2014-08-26 ENCOUNTER — Encounter (INDEPENDENT_AMBULATORY_CARE_PROVIDER_SITE_OTHER): Payer: BC Managed Care – PPO

## 2014-10-21 ENCOUNTER — Ambulatory Visit (HOSPITAL_BASED_OUTPATIENT_CLINIC_OR_DEPARTMENT_OTHER): Payer: BC Managed Care – PPO | Admitting: Neurology

## 2014-10-21 ENCOUNTER — Encounter (INDEPENDENT_AMBULATORY_CARE_PROVIDER_SITE_OTHER): Payer: Self-pay | Admitting: Neurology

## 2014-10-21 ENCOUNTER — Encounter (INDEPENDENT_AMBULATORY_CARE_PROVIDER_SITE_OTHER): Payer: BC Managed Care – PPO | Admitting: Neurology

## 2014-10-21 ENCOUNTER — Ambulatory Visit
Admission: RE | Admit: 2014-10-21 | Discharge: 2014-10-21 | Disposition: A | Discharge status: Home / Self Care | Payer: BC Managed Care – PPO | Source: Ambulatory Visit | Attending: Neurology | Admitting: Neurology

## 2014-10-21 VITALS — BP 130/82 | HR 64 | Ht 66.0 in | Wt 152.3 lb

## 2014-10-21 DIAGNOSIS — H468 Other optic neuritis: Secondary | ICD-10-CM

## 2014-10-21 DIAGNOSIS — Z87891 Personal history of nicotine dependence: Secondary | ICD-10-CM | POA: Insufficient documentation

## 2014-10-21 DIAGNOSIS — G35 Multiple sclerosis: Secondary | ICD-10-CM

## 2014-10-21 DIAGNOSIS — H469 Unspecified optic neuritis: Secondary | ICD-10-CM

## 2014-10-21 LAB — COMPREHENSIVE METABOLIC PANEL, NON-FASTING
ALBUMIN: 4.2 g/dL (ref 3.5–5.0)
ALKALINE PHOSPHATASE: 36 U/L (ref <150)
ALT (SGPT): 17 U/L (ref <55)
ANION GAP: 7 mmol/L (ref 4–13)
AST (SGOT): 17 U/L (ref 8–41)
BILIRUBIN, TOTAL: 1 mg/dL (ref 0.3–1.3)
BUN/CREAT RATIO: 26 — ABNORMAL HIGH (ref 6–22)
BUN: 21 mg/dL (ref 8–25)
CALCIUM: 8.8 mg/dL (ref 8.5–10.4)
CARBON DIOXIDE: 25 mmol/L (ref 22–32)
CARBON DIOXIDE: 25 mmol/L (ref 22–32)
CHLORIDE: 106 mmol/L (ref 96–111)
CREATININE: 0.8 mg/dL (ref 0.49–1.10)
ESTIMATED GLOMERULAR FILTRATION RATE: >59 mL/min/{1.73_m2} (ref >59)
GLUCOSE,NONFAST: 102 mg/dL (ref 65–139)
GLUCOSE,NONFAST: 102 mg/dL (ref 65–139)
POTASSIUM: 3.8 mmol/L (ref 3.5–5.1)
POTASSIUM: 3.8 mmol/L (ref 3.5–5.1)
SODIUM: 138 mmol/L (ref 136–145)
TOTAL PROTEIN: 6.4 g/dL (ref 6.4–8.3)
TOTAL PROTEIN: 6.4 g/dL (ref 6.4–8.3)

## 2014-10-21 LAB — CBC/DIFF
BASOPHILS: 1 %
BASOS ABS: 0.052 10*3/uL (ref 0.000–0.200)
EOS ABS: 0.055 10*3/uL (ref 0.000–0.500)
EOSINOPHIL: 1 %
HCT: 36.1 % (ref 33.5–45.2)
HGB: 12 g/dL (ref 11.2–15.2)
LYMPHOCYTES: 29 %
LYMPHS ABS: 1.368 THOU/uL (ref 1.000–4.800)
MCH: 30.4 pg (ref 27.4–33.0)
MCHC: 33.3 g/dL (ref 32.5–35.8)
MCV: 91.4 fL (ref 78–100)
MONOCYTES: 7 %
MONOS ABS: 0.317 THOU/uL (ref 0.300–1.000)
MPV: 8.9 fL (ref 7.5–11.5)
PLATELET COUNT: 205 10*3/uL (ref 140–450)
PMN ABS: 2.961 10*3/uL (ref 1.500–7.700)
PMN'S: 62 %
RBC: 3.95 MIL/uL (ref 3.63–4.92)
RDW: 13 % (ref 12.0–15.0)
WBC: 4.8 10*3/uL (ref 3.5–11.0)

## 2014-10-21 LAB — VITAMIN B12: VITAMIN B12: 845 pg/mL (ref 200–1000)

## 2014-10-21 LAB — FOLATE: FOLATE: 16.3 ng/mL (ref >6.9)

## 2014-10-21 MED ORDER — ARMODAFINIL 200 MG TABLET
200.0000 mg | ORAL_TABLET | Freq: Every day | ORAL | Status: DC
Start: 2014-10-21 — End: 2016-04-21

## 2014-10-22 ENCOUNTER — Encounter (INDEPENDENT_AMBULATORY_CARE_PROVIDER_SITE_OTHER): Payer: Self-pay | Admitting: Neurology

## 2014-10-22 LAB — SS-B/LA ANTIBODIES, IGG, SERUM
SS-B/LA ANTIBODIES, IGG, QUALITATIVE: NEGATIVE
SS-B/LA ANTIBODIES, IGG, QUANTITATIVE: 9 [AU]/ml (ref <150)

## 2014-10-22 LAB — ANGIOTENSIN CONVERTING ENZYME (ACE), SERUM: ANGIOTENSIN CONVERTING ENZYME: 28 U/L

## 2014-10-22 NOTE — Progress Notes (Signed)
Called the pt to sch her MRI"s and she says she will need to go to the [place] for scans.  Didn't sch at this time per pt

## 2014-10-23 LAB — PHOSPHOLIPID (CARDIOLIPIN) IGG & IGM
PHOSPHOLIPID (CARDIOLIPIN) ANTIBODIES, IGG, SERUM: <4 [GPL'U]
PHOSPHOLIPID (CARDIOLIPIN) ANTIBODIES, IGM, SERUM: <4 [MPL'U]

## 2014-10-24 LAB — SS-A/RO ANTIBODIES, IGG, SERUM
SS-A/RO ANTIBODIES, IGG, QUALITATIVE: NEGATIVE
SS-A/RO ANTIBODIES, IGG, QUANTITATIVE: 29 [AU]/ml (ref <150)

## 2014-10-24 LAB — VITAMIN D, SERUM (25 HYDROXYVITAMIN D2 AND D3 BY MS)
25 HYDROXYVITAMIN D2/D3,total: 88.4 ng/mL (ref <100)
25 HYDROXYVITAMIN D2: <4 ng/mL
25 HYDROXYVITAMIN D3: 88.4 ng/mL

## 2014-10-29 ENCOUNTER — Encounter (INDEPENDENT_AMBULATORY_CARE_PROVIDER_SITE_OTHER): Payer: BC Managed Care – PPO | Admitting: Neurology

## 2014-11-04 NOTE — H&P (Addendum)
[facility]  Multiple Sclerosis [facility]  New Patient Visit Form      Lindsey Kirby, Lindsey Kirby, 39 y.o. female  Date of service: 10/21/2014   Date of Birth:  1975-12-28    PCP: Lezlie Octave, MD  Consult Requested By: No referring provider defined for this encounter.    Information obtained from: patient  HPI: Lindsey Kirby is a 39 y.o. female who was admitted on 12-29 for 4 months of bilateral blurry vision likely from MS exacerbation. Symptoms worsen with heat. Pt was diagnosed with MS in 2001 and was well managed on Tysabri for a period. It was stopped due to financial and insurance issues. She has also tried Avonex, Copaxone, and Gileneya. Pt was started on high dose steroids for 3 days with some improvement in her symptoms. MRI orbits w/wo contrast showed enlargement/edema of optic chiasm extending to optic nerves, worse on left, compatible with optic neuritis.  The patient reports some improvement in her visual acuity with the use of corticosteroids.  She has not been put back on disease modifying therapy at this point.  Interestingly, the patient has a history of autoimmune thyroid disease.  She reports no new events since she was discharged from the hospital.    Past Medical History:    Past Medical History   Diagnosis Date    MS (multiple sclerosis)     Hashimoto's thyroiditis     Wears glasses      wears contacts     Medications:   Outpatient Prescriptions Marked as Taking for the 10/21/14 encounter (Office Visit) with Guadalupe Maple, MD   Medication Sig    armodafinil (NUVIGIL) 200 mg Oral Tablet Take 1 Tab (200 mg total) by mouth Once a day    Ascorbic Acid (VITAMIN C) 1,000 mg Oral Tablet Take 1,000 mg by mouth Once a day    cholecalciferol, vitamin D3, 1,000 unit Oral Tablet Take 10,000 Units by mouth Once a day    folic acid (FOLVITE) 1 mg Oral Tablet Take 1 mg by mouth Once a day    IBUPROFEN/DIPHENHYDRAMINE CIT (ADVIL PM ORAL) Take by mouth    SYNTHROID 100 mcg Oral Tablet TAKE 1 TABLET  BY MOUTH EVERY DAY    vitamin B complex Oral Tablet Take 1 Tab by mouth Once a day     Allergies: No Known Allergies    Family History:   Family History   Problem Relation Age of Onset    Hypertension       Surgical History: No past surgical history on file.    Social History:    History     Social History    Marital Status: Married     Spouse Name: N/A    Number of Children: N/A    Years of Education: N/A     Social History Main Topics    Smoking status: Former Smoker -- 0.00 packs/day for 12 years     Types: Cigarettes     Quit date: 02/24/2007    Smokeless tobacco: Not on file    Alcohol Use: Yes      Comment: wine 2x/yr    Drug Use: No    Sexual Activity: Not on file     Other Topics Concern    Not on file     Social History Narrative     Review of Systems  Constitutional- Fatigue +, Insomnia -, Weight changes -  Eyes- Decreased vision +, diplopia -  ENT- Hearing normal  CV- No  chest pain  Resp- No Shortness of breath  GI- No diarrhea  GU- Urgency -, incontinence -, UTI -, tenesmus -  MS- No Arthritis  Skin- No rash  Psych- No depression  Endo- No DM  Heme- No nodes    PHYSICAL EXAM:    BP 130/82 mmHg   Pulse 64   Ht 1.676 m (5\' 6" )   Wt 69.1 kg (152 lb 5.4 oz)   BMI 24.60 kg/m2    Appearance: No Acute Distress  Ophthalmoscopic: Disc Flat, Normal fundus  Carotid/Heart/Peripheral Vascular: No Bruits, RRR  Orientation: Awake, Alert, and Oriented x 3  Mental status:  Memory: Registation 3/3 Recall 3/3  Attention: Normal  Knowledge: Appropriate  Language: No aphasia  Speech: No dysarthria  Cranial Nerves:  2 No Visual Defect on Confrontation; Pupils round, equal, reactive to light  3,4,6 Extraocular Movements Intact; no nystagmus  5 Facial Sensation Intact  7 No facial asymmetry  8 Intact hearing  9,10 Palate symmetric, normal gag  11 Good shoulder shrug  12 Tongue Midline  Gait: Stable, No ataxia, can perform tandem walking.  Coordination: No ataxia with finger to nose testing and heel to shin  testing  Sensory: Intact, Symmetric to Pinprick, Light Touch, Vibration, and Joint Position  Muscle Tone: Normal  Muscle exam  Arm Right Left Leg Right Left   Deltoid 5/5 5/5 Iliopsoas 5/5 5/5   Biceps 5/5 5/5 Quads 5/5 5/5   Triceps 5/5 5/5 Hamstrings 5/5 5/5   Wrist Extension 5/5 5/5 Ankle Dorsi Flexion 5/5 5/5   Wrist Flexion 5/5 5/5 Ankle Plantar Flexion 5/5 5/5   Interossei 5/5 5/5 Ankle Eversion 5/5 5/5   APB 5/5 5/5 Ankle Inversion 5/5 5/5       Reflexes   RJ BJ TJ KJ AJ Plantars Hoffman's   Right 2+ 2+ 2+ 2+ 2+ Downgoing Not present   Left 2+ 2+ 2+ 2+ 2+ Downgoing Not present     Personal review of  Diabetes Monitors  A1C - Glucose - Lipids Microalbumin   No results for input(s): HA1C, GLUCOSEFAST, CHOLESTEROL, HDLCHOL, LDLCHOL, LDLCHOLDIR, TRIG in the last 56387 hours. No results for input(s): MICALBRNUR, MICALBCRERAT in the last 56433 hours.     Diabetic foot exam:   No edema bilaterally.       Labs:  Results for orders placed or performed during the hospital encounter of 10/21/14 (from the past 672 hour(s))   ANGIOTENSIN CONVERTING ENZYME (ACE), SERUM   Result Value Ref Range    ANGIOTENSIN CONVERTING ENZYME 28 U/L   CBC/DIFF   Result Value Ref Range    WBC 4.8 3.5 - 11.0 THOU/uL    RBC 3.95 3.63 - 4.92 MIL/uL    HGB 12.0 11.2 - 15.2 g/dL    HCT 29.5 18.8 - 41.6 %    MCV 91.4 78 - 100 fL    MCH 30.4 27.4 - 33.0 pg    MCHC 33.3 32.5 - 35.8 g/dL    RDW 60.6 30.1 - 60.1 %    PLATELET COUNT 205 140 - 450 THOU/uL    MPV 8.9 7.5 - 11.5 fL    PMN'S 62 %    PMN ABS 2.961 1.500 - 7.700 THOU/uL    LYMPHOCYTES 29 %    LYMPHS ABS 1.368 1.000 - 4.800 THOU/uL    MONOCYTES 7 %    MONOS ABS 0.317 0.300 - 1.000 THOU/uL    EOSINOPHIL 1 %    EOS ABS 0.055 0.000 - 0.500 THOU/uL  BASOPHILS 1 %    BASOS ABS 0.052 0.000 - 0.200 THOU/uL   COMPREHENSIVE METABOLIC PANEL, NON-FASTING   Result Value Ref Range    ALBUMIN 4.2 3.5 - 5.0 g/dL    BILIRUBIN, TOTAL 1.0 0.3 - 1.3 mg/dL    CALCIUM 8.8 8.5 - 60.4 mg/dL    CHLORIDE 540 96 -  981 mmol/L    CREATININE 0.80 0.49 - 1.10 mg/dL    ESTIMATED GLOMERULAR FILTRATION RATE >59 >59 ml/min/1.23m2    GLUCOSE,NONFAST 102 65 - 139 mg/dL    ALKALINE PHOSPHATASE 36 <150 U/L    POTASSIUM 3.8 3.5 - 5.1 mmol/L    TOTAL PROTEIN 6.4 6.4 - 8.3 g/dL    SODIUM 191 478 - 295 mmol/L    AST (SGOT) 17 8 - 41 U/L    BUN 21 8 - 25 mg/dL    BUN/CREAT RATIO 26 (H) 6 - 22    CARBON DIOXIDE 25 22 - 32 mmol/L    ANION GAP 7 4 - 13 mmol/L    ALT (SGPT) 17 <55 U/L   SS-A/RO ANTIBODIES, IGG, SERUM   Result Value Ref Range    SS-A/RO ANTIBODIES, IGG, QUALITATIVE NEGATIVE NEGATIVE    SS-A/RO ANTIBODIES, IGG, QUANTITATIVE 29 <150 AAU/mL   SS-B/LA ANTIBODIES, IGG, SERUM   Result Value Ref Range    SS-B/LA ANTIBODIES, IGG, QUALITATIVE NEGATIVE NEGATIVE    SS-B/LA ANTIBODIES, IGG, QUANTITATIVE 9 <150 AAU/mL   VITAMIN B12   Result Value Ref Range    VITAMIN B12 845 200 - 1000 pg/mL   VITAMIN D, SERUM (25 HYDROXYVITAMIN D2 AND D3 BY MS)   Result Value Ref Range    25 HYDROXYVITAMIN D2 <4.0 ng/mL    25 HYDROXYVITAMIN D3 88.4 ng/mL    25 HYDROXYVITAMIN D2/D3,total 88.4 <100 ng/mL   PHOSPHOLIPID (CARDIOLIPIN) ANTIBODIES, IGG AND IGM, SERUM   Result Value Ref Range    PHOSPHOLIPID (CARDIOLIPIN) ANTIBODIES, IGM, SERUM <4.0 MPL    PHOSPHOLIPID (CARDIOLIPIN) ANTIBODIES, IGG, SERUM <4.0 GPL   FOLATE   Result Value Ref Range    FOLATE 16.3 >6.9 ng/mL       Imaging:  I have pressure reviewed the patient's MRI orbits from July 30, 2014 with the following findings:  Abnormal appearance of enlargement and edema seen in optic chiasm extending into prechiasmatic optic nerves, more prominent on the left. There is subtle enhancement seen in the chiasm extending into the prechiasmatic optic nerves, again more prominent on the left. The appearances compatible with optic neuritis although infiltrative neoplasm cannot exclude on the basis of imaging.    CSF:         Impressions/Recommendations:    ICD-10-CM    1. 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 female With a remote history  of multiple sclerosis.  She was recently admitted to the hospital on December 2015 with what appears to be optic neuritis.  She received intravenous corticosteroids with improvement of her visual acuity.  I will have her see ophthalmology.  I will get imaging of her brain and spine as well as JC virus antibody tests to see whether the patient is eligible for Tysabri treatment.  Based on results we will decide on a disease modifying agent.  In the meantime, she was instructed to contact the clinic should any new problems arise.    This document was generated using a voice recognition system. May have misspelled words, wrong words, or syntax and grammatical errors because of the imperfect nature of the system.      Guadalupe Maple, MD 11/04/2014, 15:26

## 2014-12-18 ENCOUNTER — Ambulatory Visit (INDEPENDENT_AMBULATORY_CARE_PROVIDER_SITE_OTHER): Payer: BC Managed Care – PPO | Admitting: Ophthalmology

## 2015-02-06 ENCOUNTER — Encounter (INDEPENDENT_AMBULATORY_CARE_PROVIDER_SITE_OTHER): Payer: BC Managed Care – PPO | Admitting: Neurology

## 2015-03-25 ENCOUNTER — Encounter (INDEPENDENT_AMBULATORY_CARE_PROVIDER_SITE_OTHER): Payer: BC Managed Care – PPO | Admitting: Neurology

## 2015-04-21 ENCOUNTER — Other Ambulatory Visit (INDEPENDENT_AMBULATORY_CARE_PROVIDER_SITE_OTHER): Payer: Self-pay | Admitting: Endocrinology, Diabetes, & Metabolism

## 2015-04-21 DIAGNOSIS — E034 Atrophy of thyroid (acquired): Secondary | ICD-10-CM

## 2016-04-07 ENCOUNTER — Other Ambulatory Visit (INDEPENDENT_AMBULATORY_CARE_PROVIDER_SITE_OTHER): Payer: Self-pay | Admitting: Endocrinology, Diabetes, & Metabolism

## 2016-04-12 ENCOUNTER — Encounter (INDEPENDENT_AMBULATORY_CARE_PROVIDER_SITE_OTHER): Payer: BC Managed Care – PPO | Admitting: Endocrinology, Diabetes, & Metabolism

## 2016-04-21 ENCOUNTER — Ambulatory Visit (INDEPENDENT_AMBULATORY_CARE_PROVIDER_SITE_OTHER): Payer: BC Managed Care – PPO | Admitting: Endocrinology, Diabetes, & Metabolism

## 2016-04-21 ENCOUNTER — Encounter (INDEPENDENT_AMBULATORY_CARE_PROVIDER_SITE_OTHER): Payer: Self-pay | Admitting: Endocrinology, Diabetes, & Metabolism

## 2016-04-21 VITALS — BP 112/80 | HR 78 | Resp 20 | Ht 66.0 in | Wt 140.0 lb

## 2016-04-21 DIAGNOSIS — E039 Hypothyroidism, unspecified: Secondary | ICD-10-CM

## 2016-04-21 DIAGNOSIS — E034 Atrophy of thyroid (acquired): Secondary | ICD-10-CM

## 2016-04-21 DIAGNOSIS — Z6822 Body mass index (BMI) 22.0-22.9, adult: Secondary | ICD-10-CM

## 2016-04-21 LAB — THYROID STIMULATING HORMONE (SENSITIVE TSH)
TSH: 2.07 mcIU/mL (ref 0.350–5.550)
TSH: 2.07 mcIU/mL (ref 0.350–5.550)

## 2016-04-21 LAB — THYROXINE, FREE (FREE T4): THYROXINE, FREE (FREE T4): 1.3 ng/dL (ref 0.9–1.8)

## 2016-04-21 MED ORDER — SYNTHROID 100 MCG TABLET
ORAL_TABLET | ORAL | 11 refills | Status: DC
Start: 2016-04-21 — End: 2016-05-28

## 2016-04-21 NOTE — Progress Notes (Signed)
[facility]  [facility] MEDICINE & SPECIALTY OFFICE  [address] [place] [address]  [phone]        Patient name:  Lindsey Kirby  MRN:  [id number]  DOB:  1976/05/21  DATE:  04/21/2016      Vitals:    04/21/16 1057   BP: 112/80   Pulse: 78   Resp: 20   SpO2: 98%   Weight: 63.5 kg (140 lb)   Height: 1.676 m (5\' 6" )       1. 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 She continues maintenance on Synthroid (branded) at 100 mcg daily. TPO antibodies were quite positive with established Hashimoto's thyroiditis confirmed. 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   Examination today revealed thyroid enlargement without distinct nodularity. There was no tenderness or adenopathy. DTR's equal, +2 with normal relaxation phase. We'll check TSH and free thyroxine with likely maintenance of 100 mcg Synthroid daily. In addition, a thyroid ultrasound obtained through Dr. Atilano Median was considered compatible with "Hashimoto's" in 2015. We'll establish follow-up on an every six month basis with thyroid studies to be drawn this morning.  2. Multiple sclerosis, previously followed by Dr, Sharlett Iles:   When last seen, [name] was doing well on Nuvigil 150 mg daily per Dr. Jonny Ruiz but the prescription has since "lapsed" following Dr. Raphael Gibney departure for the [facility]. The medication is not available through the [facility] and is "too expensive" therefore she was changed to Adderall. An adverse reaction with Adderall however with visual blurring prompted discontinuation. She currently is on no specific medications for morphine sulphate-MS with continued efforts to return to [facility] pending. She was last seen through Neurology at [facility] in 2016.    Problem List Items Addressed This Visit      Hypothyroid - Primary    Relevant Medications    SYNTHROID 100 mcg Oral Tablet    Other Relevant Orders    THYROID STIMULATING HORMONE (SENSITIVE TSH)    Free T4          [name], MD  04/21/2016, 11:01

## 2016-05-28 ENCOUNTER — Other Ambulatory Visit (INDEPENDENT_AMBULATORY_CARE_PROVIDER_SITE_OTHER): Payer: Self-pay | Admitting: Endocrinology, Diabetes, & Metabolism

## 2016-05-28 DIAGNOSIS — E034 Atrophy of thyroid (acquired): Secondary | ICD-10-CM

## 2016-05-31 MED ORDER — SYNTHROID 100 MCG TABLET
ORAL_TABLET | ORAL | 3 refills | Status: DC
Start: 2016-05-31 — End: 2017-06-10

## 2016-10-20 ENCOUNTER — Encounter (INDEPENDENT_AMBULATORY_CARE_PROVIDER_SITE_OTHER): Payer: BC Managed Care – PPO | Admitting: Endocrinology, Diabetes, & Metabolism

## 2016-10-20 DIAGNOSIS — E039 Hypothyroidism, unspecified: Secondary | ICD-10-CM

## 2017-06-10 ENCOUNTER — Other Ambulatory Visit (INDEPENDENT_AMBULATORY_CARE_PROVIDER_SITE_OTHER): Payer: Self-pay

## 2017-06-10 DIAGNOSIS — E034 Atrophy of thyroid (acquired): Secondary | ICD-10-CM

## 2017-06-10 MED ORDER — SYNTHROID 100 MCG TABLET
ORAL_TABLET | ORAL | 3 refills | Status: DC
Start: 2017-06-10 — End: 2018-05-27

## 2017-08-11 ENCOUNTER — Encounter (INDEPENDENT_AMBULATORY_CARE_PROVIDER_SITE_OTHER): Payer: Self-pay

## 2017-08-11 ENCOUNTER — Ambulatory Visit (INDEPENDENT_AMBULATORY_CARE_PROVIDER_SITE_OTHER): Payer: BC Managed Care – PPO

## 2017-08-11 VITALS — BP 110/80 | HR 70 | Resp 16 | Ht 66.0 in | Wt 136.4 lb

## 2017-08-11 DIAGNOSIS — E063 Autoimmune thyroiditis: Secondary | ICD-10-CM

## 2017-08-11 DIAGNOSIS — Z6822 Body mass index (BMI) 22.0-22.9, adult: Secondary | ICD-10-CM

## 2017-08-11 LAB — THYROXINE, FREE (FREE T4): THYROXINE, FREE (FREE T4): 1.4 ng/dL (ref 0.9–1.8)

## 2017-08-11 LAB — THYROID STIMULATING HORMONE (SENSITIVE TSH): TSH: 1.83 mcIU/mL (ref 0.350–5.550)

## 2017-08-11 NOTE — Progress Notes (Signed)
ENDOCRINOLOGY, MEDICAL STAFF OFFICE [facility]  [address] [place] [address]       Name: Lindsey Kirby MRN:  [id number]   Date: 08/11/2017 Age: 42 y.o.       Chief Complaint: Follow Up and Hypothyroidism    Summary: Lindsey Kirby is a 39 y old female with a hx of Hashimoto's thyroiditis diagnosed in 2011.    Interval history:  -Last seen by Dr. Berneice Gandy in 04/2016  -She is on biotin for hair    ROS:  Heat or cold intolerance: Usual  Dry or sweaty skin: usual  Hyperdefication: no     Constipation: no      Palpitations: no  Anxiety/nervousness: no  Tremor: no     Weight loss or gain: stable    Energy level: fair  Hair loss: yes   Brittle nails: yes        Compressive sx:      -Hoarse voice: no      -Problems swallowing food/liquids: no    -Inability to lay flat on back: no  Eye Sx:    -Pain behind the eyes: no  -Dry eyes or "gritty" feeling: no        -Proptosis: no  -Red eye: no    Ionizing radiation exposure: no  Family history of thyroid problems:Mother has hypothyroidism, Aunts have hypothyroidism  -Thyroid carcinoma:No    Past Medical History  Past Medical History:   Diagnosis Date   . Hashimoto's thyroiditis    . MS (multiple sclerosis) (CMS HCC)    . Wears glasses     wears contacts        Past surgical history:  Reviewed         Current Outpatient Medications   Medication Sig   . dextroamphetamine-amphetamine (ADDERALL) 10 mg Oral Tablet Take 10 mg by mouth Three times a day   . folic acid (FOLVITE) 1 mg Oral Tablet Take 1 mg by mouth Once a day   . IBUPROFEN/DIPHENHYDRAMINE CIT (ADVIL PM ORAL) Take by mouth   . multivitamin Oral Tablet Take 1 Tab by mouth Once a day   . sour cherry extract (TART CHERRY EXTRACT ORAL) Take 1 Tab by mouth Once a day   . SYNTHROID 100 mcg Oral Tablet TAKE 1 TABLET BY MOUTH EVERY DAY     No Known Allergies    Review of Systems  Other than ROS in the HPI, all other systems were negative.    Examination:  BP 110/80   Pulse 70   Resp 16   Ht 1.676 m (5\' 6" )   Wt  61.9 kg (136 lb 6.4 oz)   LMP 07/21/2017 (Exact Date)   SpO2 99% Comment: Resting, room air  Breastfeeding? No   BMI 22.02 kg/m       General: appears in good health  Eyes: Conjunctiva clear.  HENT:TM's Clear.   Neck: supple, symmetrical, trachea midline. Goiter +  Lungs: Clear to auscultation bilaterally.   Cardiovascular: regular rate and rhythm  Abdomen: Soft, non-tender  Genito-urinary: not examined  Extremities: No cyanosis or edema  Skin: Skin warm and dry  Neurologic: Grossly normal  Lymphatics: No lymphadenopathy  Psychiatric: Normal affect, behavior, memory, thought content, judgement, and speech.    Data reviewed:  reviewed  Assessment and Plan  1. Hashimoto's thyroiditis: A thyroid ultrasound obtained through Dr. Atilano Median was considered compatible with "Hashimoto's" in 2015.   -check TSH and free T4 today  -continue synthroid 100 mcg q daily  for now    1. Hashimoto's thyroiditis        Orders Placed This Encounter   . THYROID STIMULATING HORMONE (SENSITIVE TSH)   . Free T4       Return in about 6 months (around 02/08/2018).    Kelle Darting, MD  Assistant Professor  Endocrinology Diabetes and Metabolism

## 2018-02-16 ENCOUNTER — Encounter (INDEPENDENT_AMBULATORY_CARE_PROVIDER_SITE_OTHER): Payer: Self-pay | Admitting: Internal Medicine

## 2018-03-31 ENCOUNTER — Encounter (INDEPENDENT_AMBULATORY_CARE_PROVIDER_SITE_OTHER): Payer: Self-pay | Admitting: Internal Medicine

## 2018-04-06 ENCOUNTER — Encounter (INDEPENDENT_AMBULATORY_CARE_PROVIDER_SITE_OTHER): Payer: Self-pay

## 2018-04-06 ENCOUNTER — Encounter (INDEPENDENT_AMBULATORY_CARE_PROVIDER_SITE_OTHER): Payer: Self-pay | Admitting: Internal Medicine

## 2018-04-06 ENCOUNTER — Ambulatory Visit (INDEPENDENT_AMBULATORY_CARE_PROVIDER_SITE_OTHER): Payer: BC Managed Care – PPO

## 2018-04-06 VITALS — BP 122/78 | HR 63 | Resp 18 | Ht 65.0 in | Wt 125.4 lb

## 2018-04-06 DIAGNOSIS — E039 Hypothyroidism, unspecified: Secondary | ICD-10-CM

## 2018-04-06 DIAGNOSIS — Z6820 Body mass index (BMI) 20.0-20.9, adult: Secondary | ICD-10-CM

## 2018-04-06 DIAGNOSIS — E063 Autoimmune thyroiditis: Secondary | ICD-10-CM

## 2018-04-06 NOTE — Progress Notes (Signed)
ENDOCRINOLOGY, MEDICAL STAFF OFFICE [facility]  [address]  [place] [address]       Name: Lindsey Kirby MRN:  [id number]   Date: 04/06/2018 Age: 42 y.o.       Chief Complaint: Hashimoto's Thyroiditis    Summary: Lindsey Kirby is a 78 y old female with a hx of Hashimoto's thyroiditis diagnosed in 2011.    Interval history:  -Last visit 08/2017  -Last seen by Dr. Berneice Gandy in 04/2016  -She is on biotin for hair    ROS:  Heat or cold intolerance: Usual  Dry or sweaty skin: usual  Hyperdefication: no     Constipation: no      Palpitations: no  Anxiety/nervousness: no  Tremor: no     Weight loss or gain: stable    Energy level: fair  Hair loss: yes   Brittle nails: yes        Compressive sx:      -Hoarse voice: no      -Problems swallowing food/liquids: no    -Inability to lay flat on back: no  Eye Sx:    -Pain behind the eyes: no  -Dry eyes or "gritty" feeling: no        -Proptosis: no  -Red eye: no    Ionizing radiation exposure: no  Family history of thyroid problems:Mother has hypothyroidism, Aunts have hypothyroidism  -Thyroid carcinoma:No    Past Medical History  Past Medical History:   Diagnosis Date   . Hashimoto's thyroiditis    . MS (multiple sclerosis) (CMS HCC)    . Wears glasses     wears contacts        Past surgical history:  Reviewed         Current Outpatient Medications   Medication Sig   . albuterol sulfate (PROVENTIL OR VENTOLIN OR PROAIR) 90 mcg/actuation Inhalation HFA Aerosol Inhaler    . BIOTIN ORAL Take by mouth   . dextroamphetamine-amphetamine (ADDERALL) 10 mg Oral Tablet Take 10 mg by mouth Three times a day   . folic acid (FOLVITE) 1 mg Oral Tablet Take 1 mg by mouth Once a day   . IBUPROFEN/DIPHENHYDRAMINE CIT (ADVIL PM ORAL) Take by mouth   . multivitamin Oral Tablet Take 1 Tab by mouth Once a day   . sour cherry extract (TART CHERRY EXTRACT ORAL) Take 1 Tab by mouth Once a day   . SYNTHROID 100 mcg Oral Tablet TAKE 1 TABLET BY MOUTH EVERY DAY     No Known Allergies    Review of  Systems  Other than ROS in the HPI, all other systems were negative.    Examination:  BP 122/78   Pulse 63   Resp 18   Ht 1.651 m (5\' 5" )   Wt 56.9 kg (125 lb 6.4 oz)   LMP  (LMP Unknown)   SpO2 99% Comment: on room air at rest  Breastfeeding? No   BMI 20.87 kg/m       General: appears in good health  Eyes: Conjunctiva clear.  HENT:TM's Clear.   Neck: supple, symmetrical, trachea midline. Goiter +  Lungs: Clear to auscultation bilaterally.   Cardiovascular: regular rate and rhythm  Abdomen: Soft, non-tender  Genito-urinary: not examined  Extremities: No cyanosis or edema  Skin: Skin warm and dry  Neurologic: Grossly normal  Lymphatics: No lymphadenopathy  Psychiatric: Normal affect, behavior, memory, thought content, judgement, and speech.    Data reviewed:  reviewed  Assessment and Plan  1. Hashimoto's thyroiditis:  A thyroid ultrasound obtained through Dr. Atilano Median was considered compatible with "Hashimoto's" in 2015.   -check TSH today  -continue synthroid 100 mcg q daily for now    1. Hypothyroidism        Orders Placed This Encounter   . THYROID STIMULATING HORMONE (SENSITIVE TSH)       Return in about 6 months (around 10/05/2018).    Kelle Darting, MD  Assistant Professor  Endocrinology Diabetes and Metabolism

## 2018-05-27 ENCOUNTER — Other Ambulatory Visit (INDEPENDENT_AMBULATORY_CARE_PROVIDER_SITE_OTHER): Payer: Self-pay

## 2018-05-27 DIAGNOSIS — E034 Atrophy of thyroid (acquired): Secondary | ICD-10-CM

## 2018-06-05 ENCOUNTER — Other Ambulatory Visit (INDEPENDENT_AMBULATORY_CARE_PROVIDER_SITE_OTHER): Payer: Self-pay

## 2018-06-05 DIAGNOSIS — E034 Atrophy of thyroid (acquired): Secondary | ICD-10-CM

## 2018-10-05 ENCOUNTER — Encounter (INDEPENDENT_AMBULATORY_CARE_PROVIDER_SITE_OTHER): Payer: Self-pay

## 2018-10-12 ENCOUNTER — Encounter (INDEPENDENT_AMBULATORY_CARE_PROVIDER_SITE_OTHER): Payer: Self-pay

## 2019-05-27 ENCOUNTER — Other Ambulatory Visit (INDEPENDENT_AMBULATORY_CARE_PROVIDER_SITE_OTHER): Payer: Self-pay

## 2019-05-27 DIAGNOSIS — E034 Atrophy of thyroid (acquired): Secondary | ICD-10-CM

## 2020-08-27 ENCOUNTER — Other Ambulatory Visit (INDEPENDENT_AMBULATORY_CARE_PROVIDER_SITE_OTHER): Payer: Self-pay

## 2020-08-27 DIAGNOSIS — E034 Atrophy of thyroid (acquired): Secondary | ICD-10-CM

## 2022-06-07 IMAGING — MR MRI BRAIN WITHOUT AND WITH CONTRAST
9 of 13 series · 33 of 48 positions shown · IV contrast (gadavist)
Comparison: MRI brain dated 05/08/2021 and outside examination dated 09/06/2019.

﻿EXAM:  MRI BRAIN WITHOUT AND WITH CONTRAST
INDICATION: Diagnosis of multiple sclerosis from age 21.  New symptoms of bilateral hand numbness including fingers. Neck pain.
TECHNIQUE: Multiplanar multisequential MRI of the brain was performed without and with 5 mL of Gadavist.

[Series 5: DWI · axial · 5.0mm · 1.35mm/px · z∈[-18,+108]mm · 9 of 88 slices shown (1 of 3)]
[im 1/88]
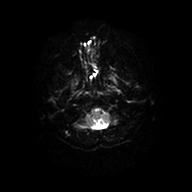
[im 11/88]
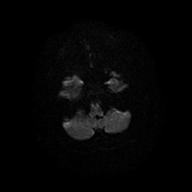
[im 22/88]
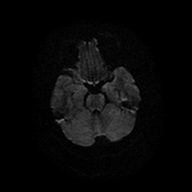
[im 33/88]
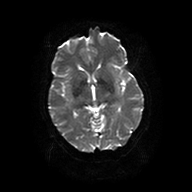
[im 44/88]
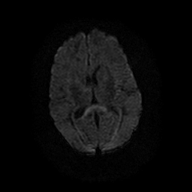
[im 55/88]
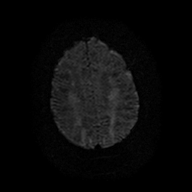
[im 66/88]
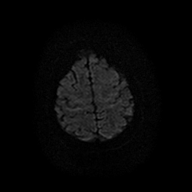
[im 77/88]
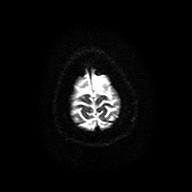
[im 88/88]
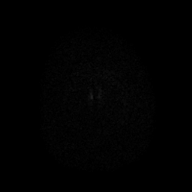

[Series 6: DWI · axial · 5.0mm · 1.35mm/px · z∈[-18,+108]mm · 2 of 22 slices shown (2 of 3)]
[im 1/22]
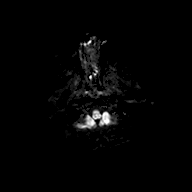
[im 22/22]
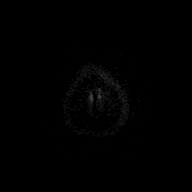

[Series 7: DWI · axial · 5.0mm · 1.35mm/px · z∈[-18,+108]mm · 2 of 21 slices shown (3 of 3)]
[im 1/21]
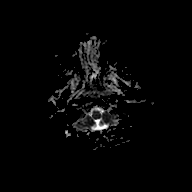
[im 21/21]
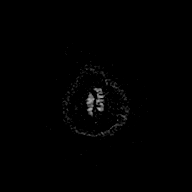

[Series 8: FLAIR · sagittal · 5.0mm · 0.75mm/px · 3 of 32 slices shown (1 of 2)]
[im 1/32]
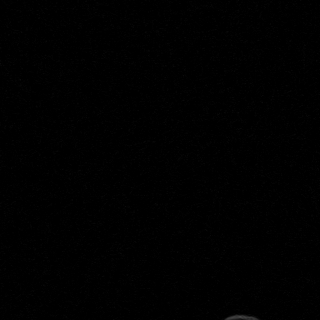
[im 16/32]
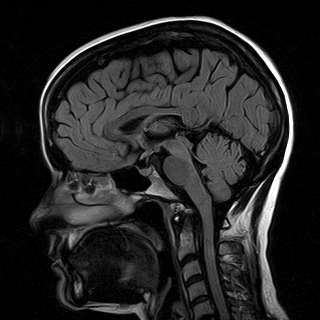
[im 32/32]
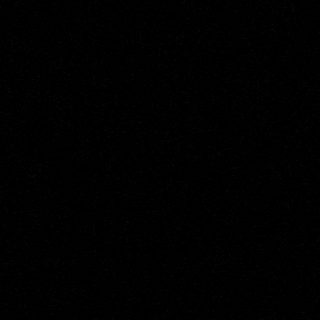

[Series 11: T1 · axial · 4.0mm · 0.43mm/px · z∈[-42,+112]mm · 4 of 36 slices shown]
[im 1/36]
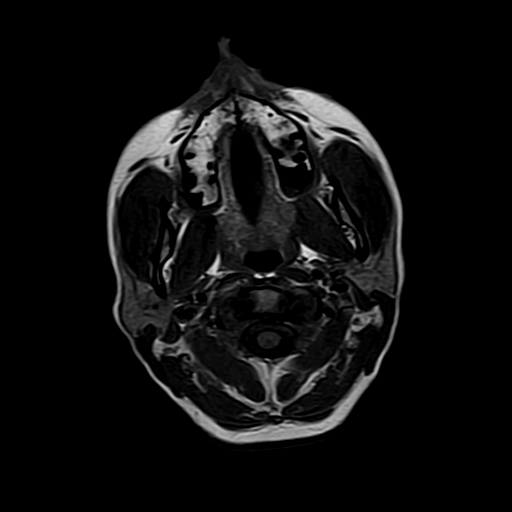
[im 12/36]
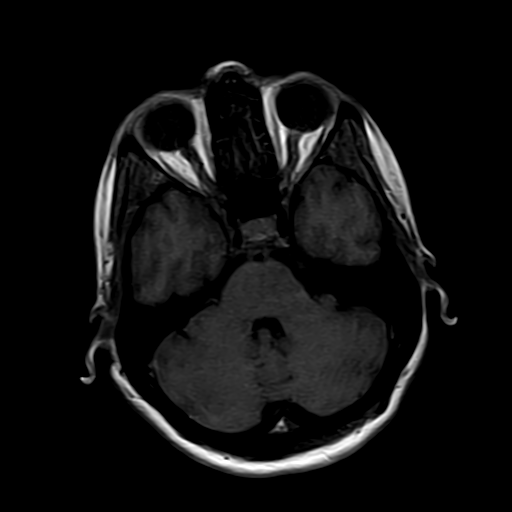
[im 24/36]
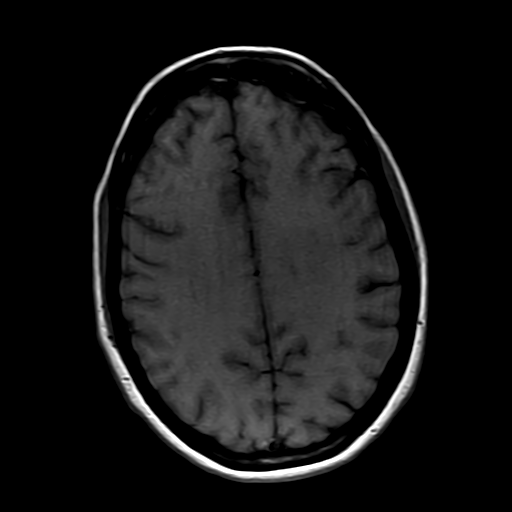
[im 36/36]
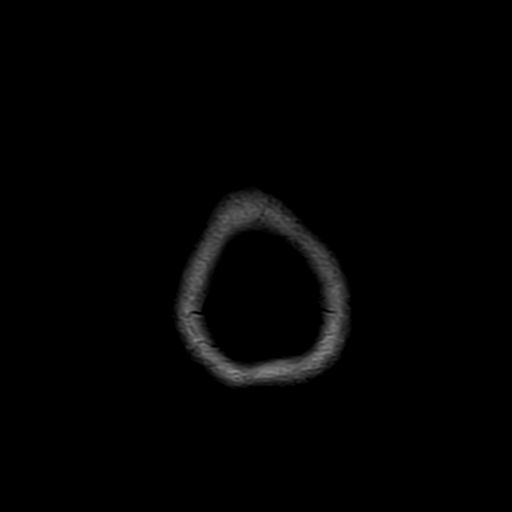

[Series 12: T2 · coronal · 5.0mm · 0.43mm/px · 3 of 28 slices shown]
[im 1/28]
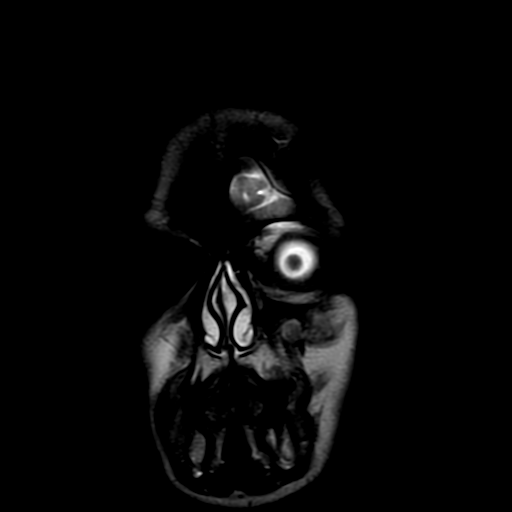
[im 14/28]
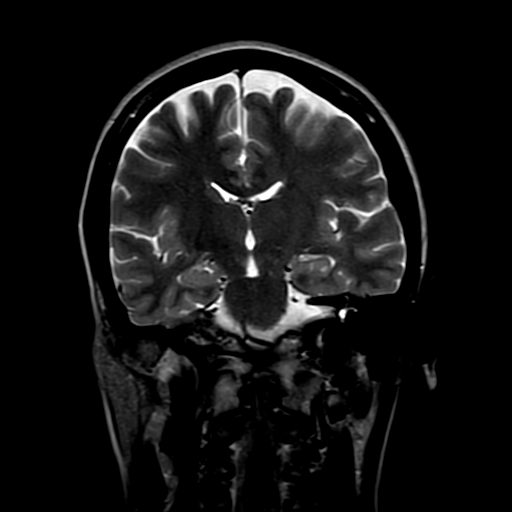
[im 28/28]
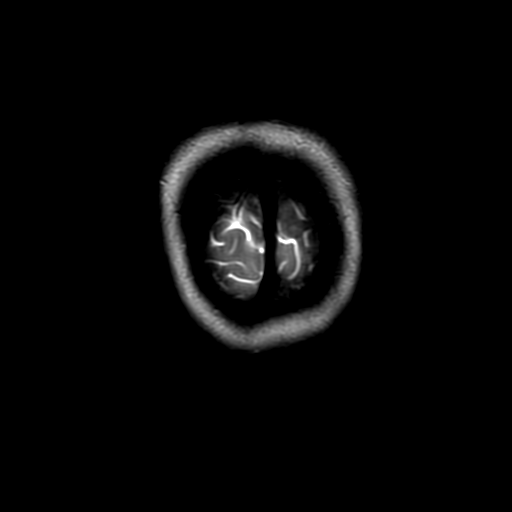

[Series 13: T1 fat-sat · axial · 4.0mm · 0.43mm/px · z∈[-42,+112]mm · 4 of 36 slices shown]
[im 1/36]
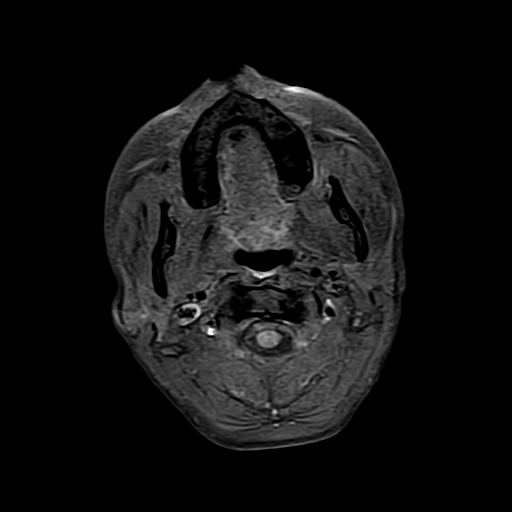
[im 12/36]
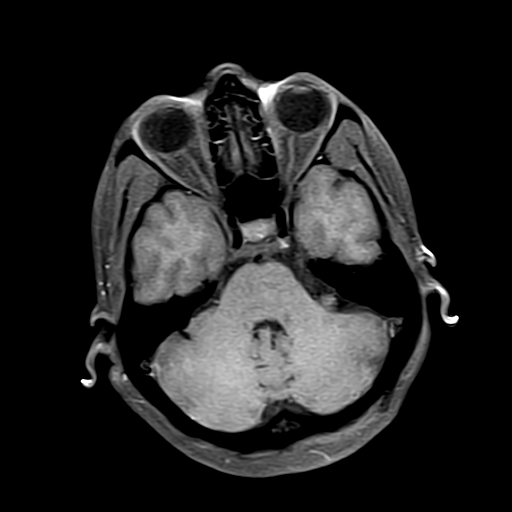
[im 24/36]
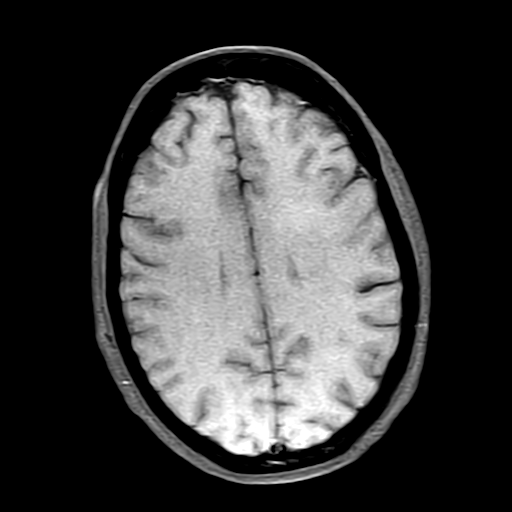
[im 36/36]
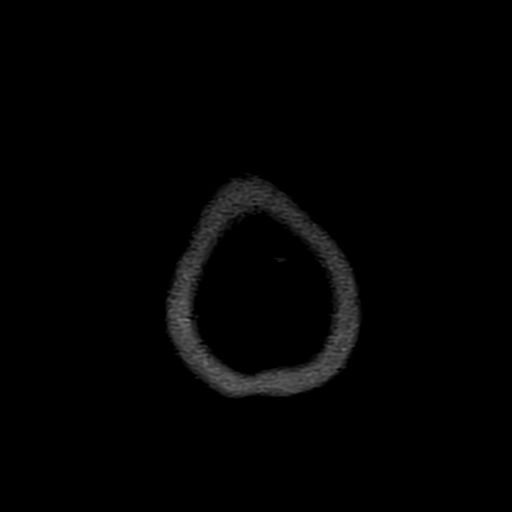

[Series 14: FLAIR · axial · 4.0mm · 0.43mm/px · z∈[-42,+112]mm · 4 of 36 slices shown (2 of 2)]
[im 1/36]
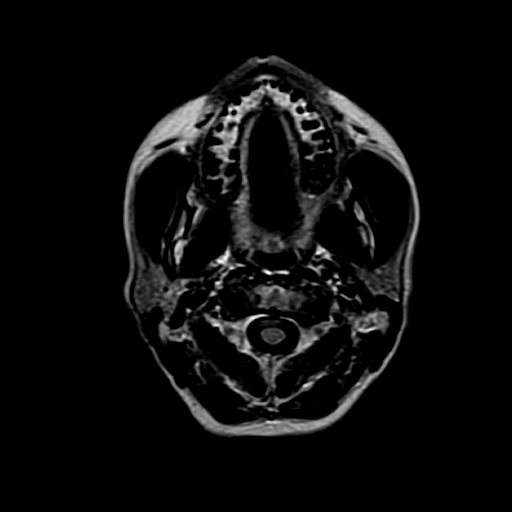
[im 12/36]
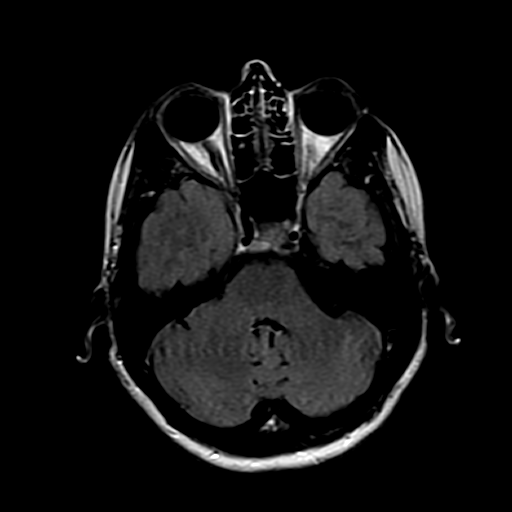
[im 24/36]
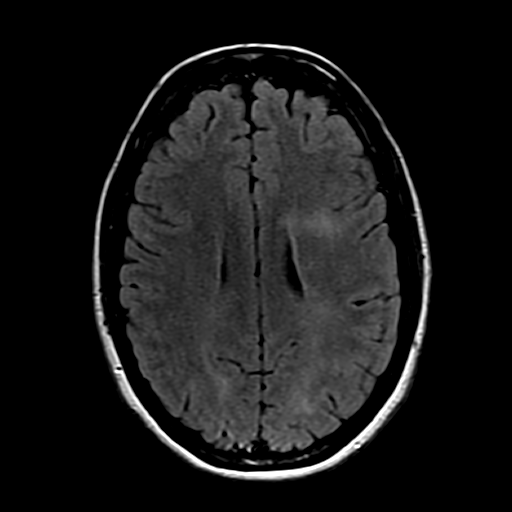
[im 36/36]
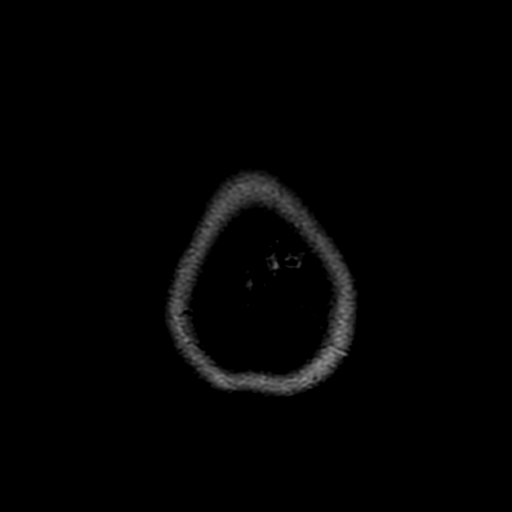

[Series 19: T1 post-contrast · axial · 5.0mm · 0.69mm/px · z∈[-28,+98]mm · 2 of 22 slices shown]
[im 1/22]
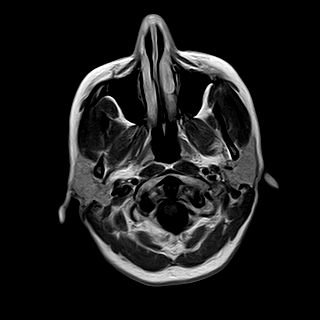
[im 22/22]
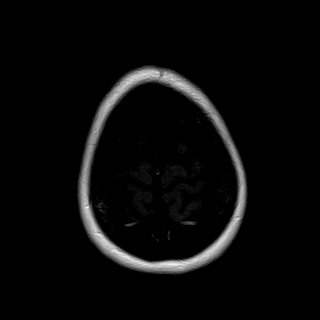

[33 of 48 positions shown; findings below may reference images not displayed]

FINDINGS: No focal areas of restricted diffusion. No abnormal enhancement on post contrast study.

FLAIR bright lesions of subcortical and periventricular white matter are noted, predominantly in the parietal and occipital locations on both sides.  Number of lesions and extent of the lesions are unchanged from 05/08/2021. 

There is no involvement of corpus callosum.  No evidence of volume loss of the cerebral hemispheres are seen.

In the brainstem, focal area of increased FLAIR signal of the midbrain pons junction is stable.  Major vascular structures are patent.
IMPRESSION: 1.  Stable FLAIR bright lesions of subcortical and periventricular white matter of both hemispheres compared with 05/08/2021.  Stable ill-defined FLAIR bright lesion at the junction of the midbrain and pons.

2. No focal areas of abnormal enhancement.  No focal areas of restricted diffusion.  

Electronically Signed by ALIPIO, FRANKYN at [id number] [DATE]

## 2022-06-07 IMAGING — MR MRI CERVICAL SPINE WITHOUT AND WITH CONTRAST
5 of 8 series · 29 of 48 positions shown · IV contrast (gadavist)
Comparison: Previous outside MRI of C-spine dated 09/06/2019..

﻿EXAM:  [id number]   MRI CERVICAL SPINE WITHOUT AND WITH CONTRAST
INDICATION: Follow-up, multiple sclerosis.
TECHNIQUE: Multiplanar multisequential MRI of the C-spine was performed including post contrast study after injection of 5 mL Gadavist IV.

[Series 5: T2 · sagittal · 3.0mm · 0.75mm/px · 5 of 13 slices shown (1 of 2)]
[im 1/13]
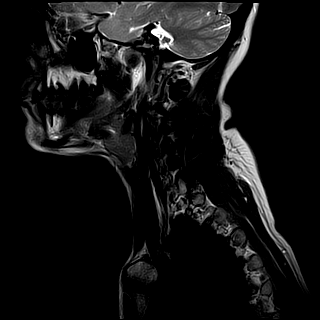
[im 4/13]
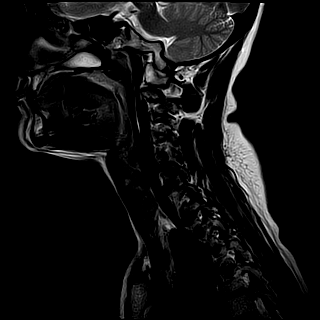
[im 7/13]
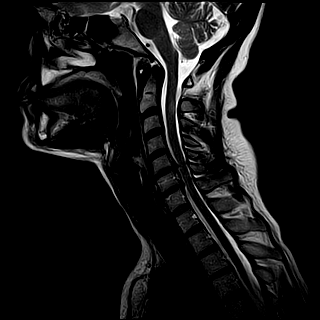
[im 10/13]
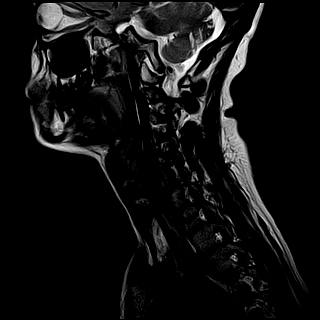
[im 13/13]
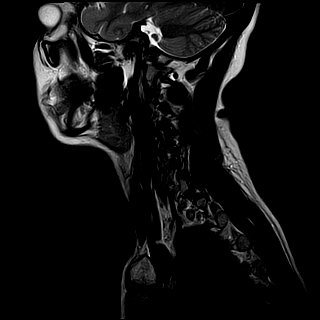

[Series 6: T1 · sagittal · 3.0mm · 0.47mm/px · 4 of 13 slices shown]
[im 1/13]
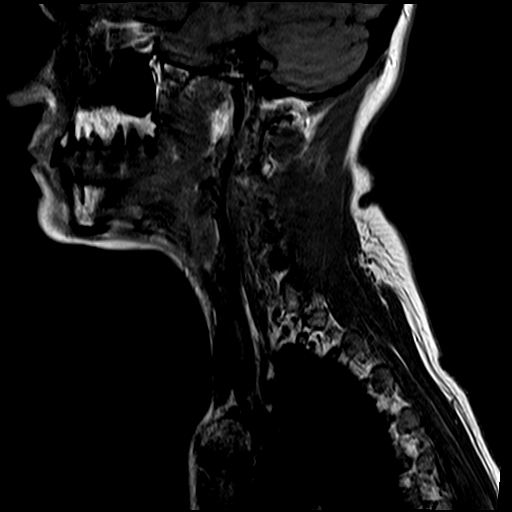
[im 5/13]
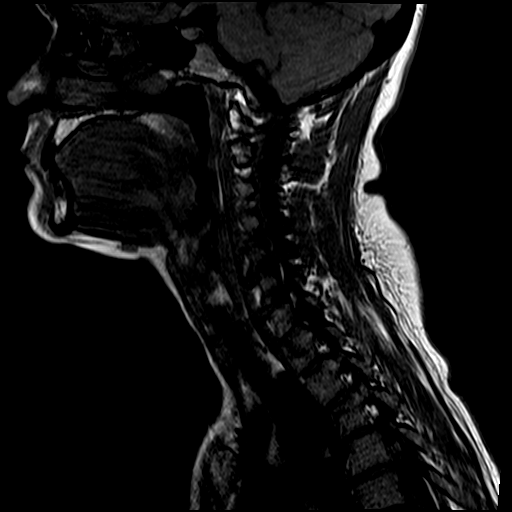
[im 9/13]
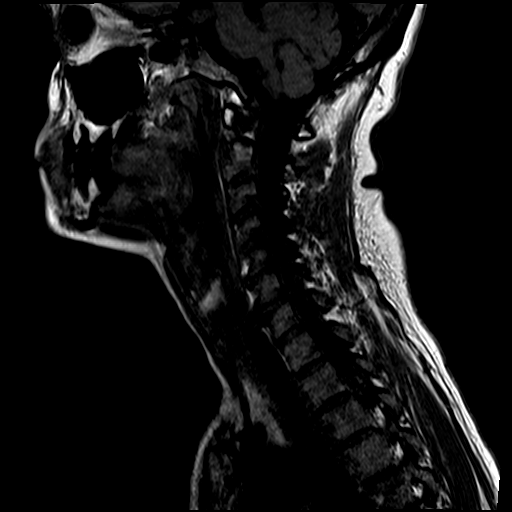
[im 13/13]
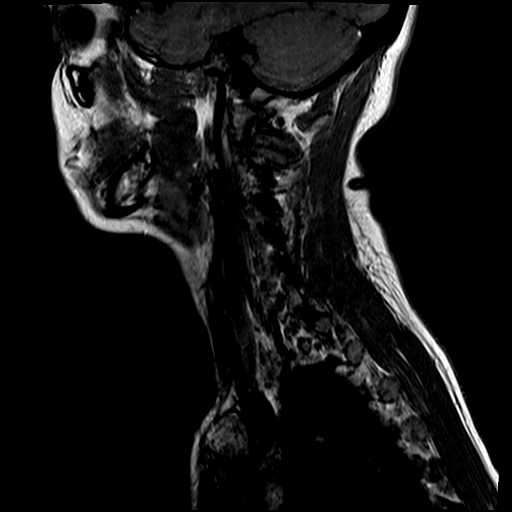

[Series 7: T1 fat-sat · sagittal · 3.0mm · 0.75mm/px · 4 of 13 slices shown (1 of 2)]
[im 1/13]
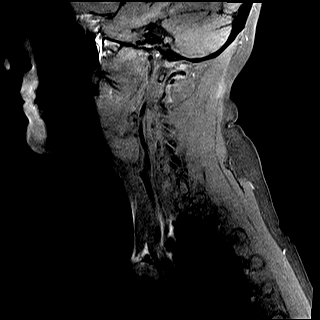
[im 5/13]
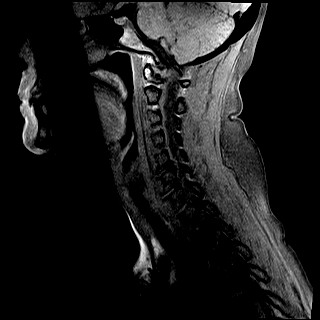
[im 9/13]
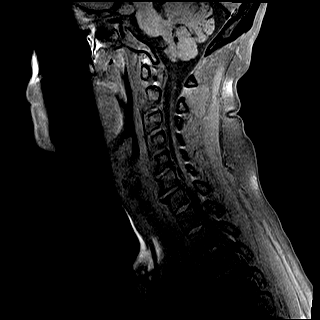
[im 13/13]
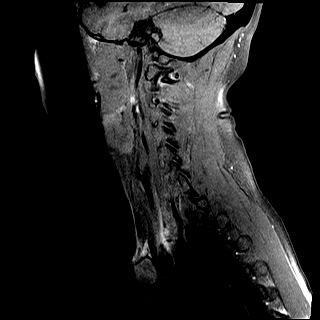

[Series 9: T2 · axial · 3.0mm · 0.35mm/px · z∈[-83,+8]mm · 9 of 26 slices shown (2 of 2)]
[im 1/26]
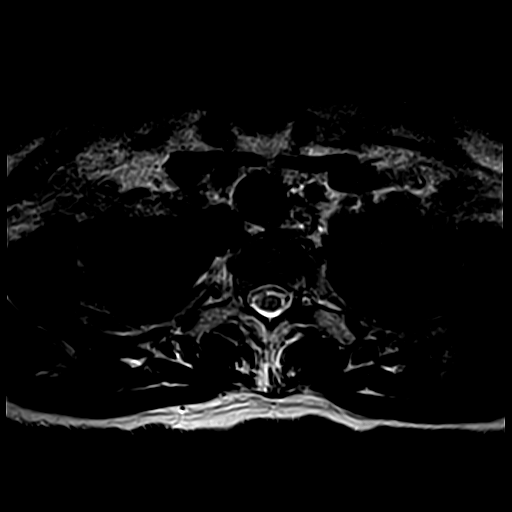
[im 4/26]
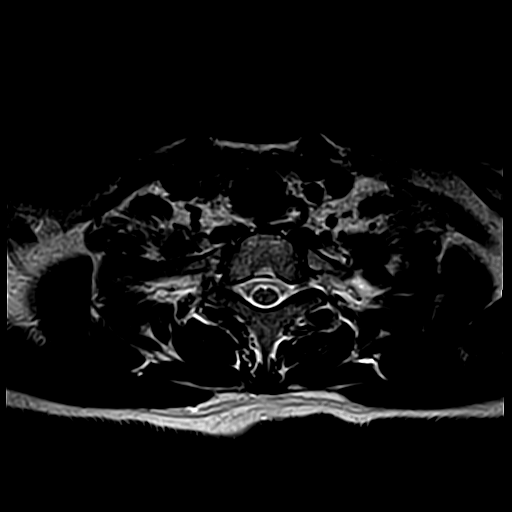
[im 7/26]
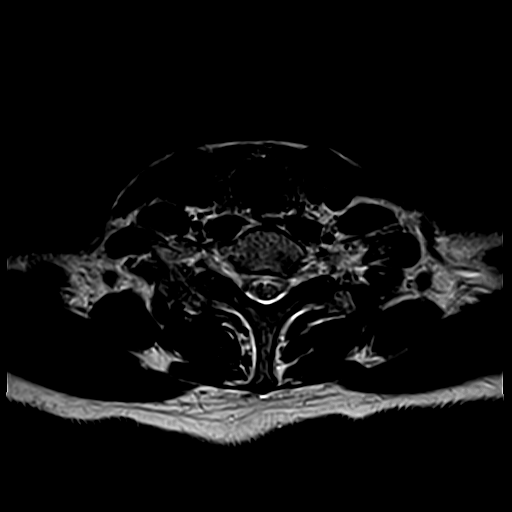
[im 10/26]
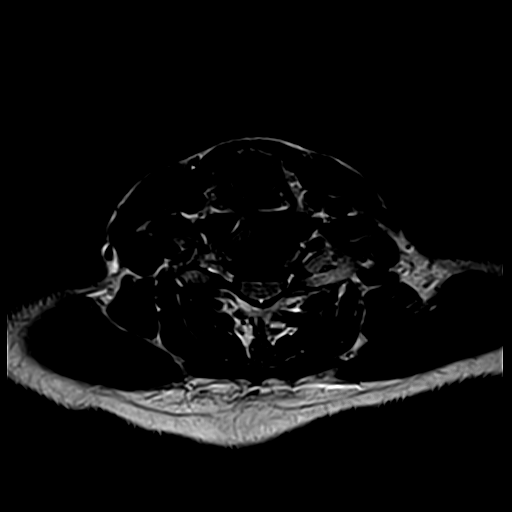
[im 13/26]
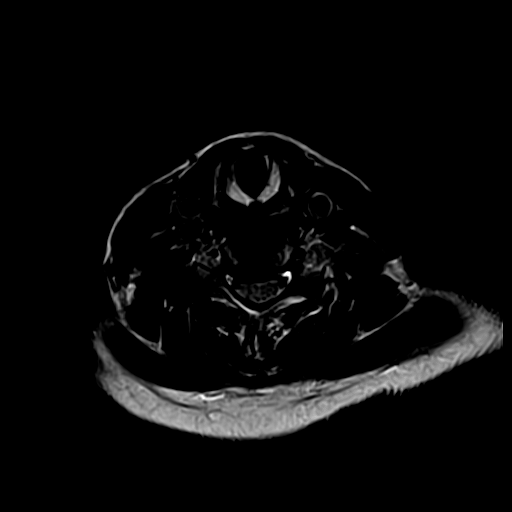
[im 16/26]
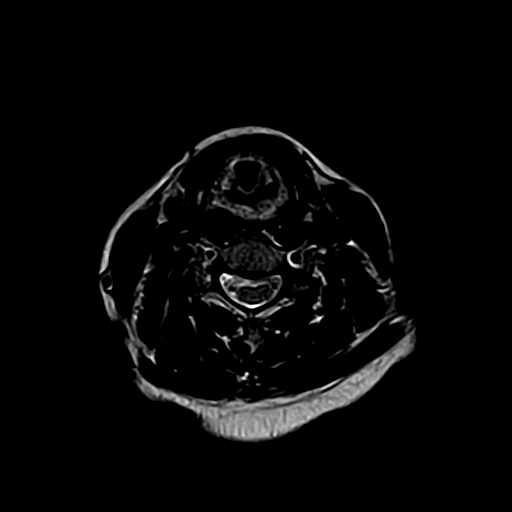
[im 19/26]
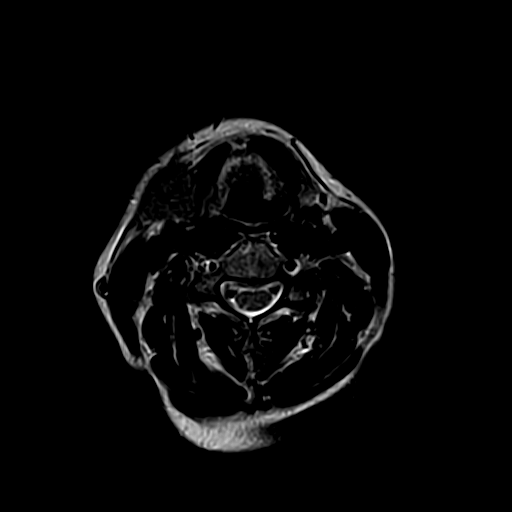
[im 22/26]
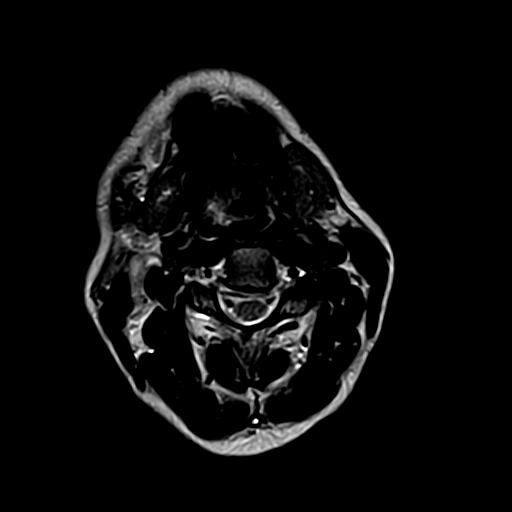
[im 26/26]
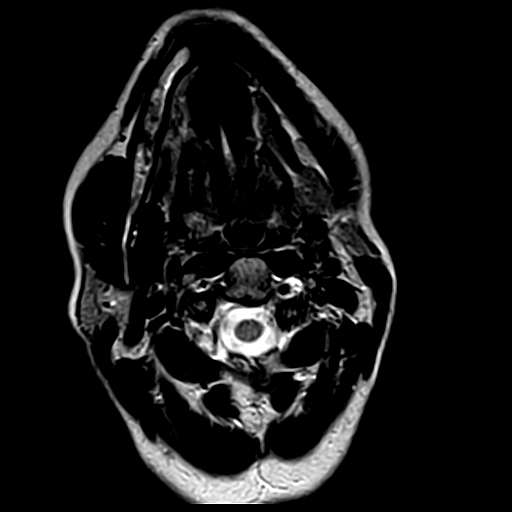

[Series 10: T1 fat-sat · axial · 3.0mm · 0.70mm/px · z∈[-83,-6]mm · 7 of 26 slices shown (2 of 2)]
[im 1/26]
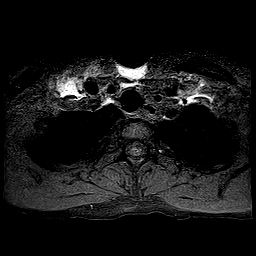
[im 4/26]
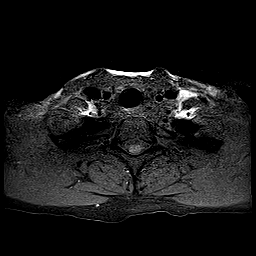
[im 7/26]
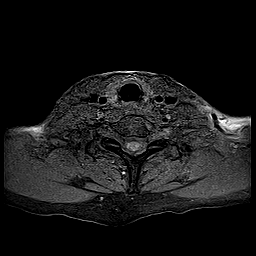
[im 10/26]
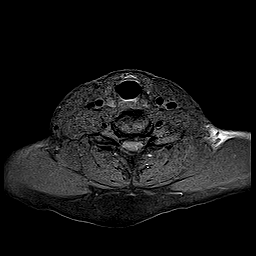
[im 16/26]
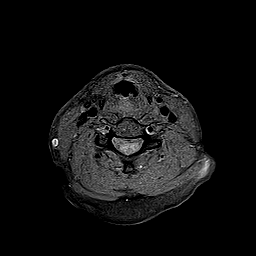
[im 19/26]
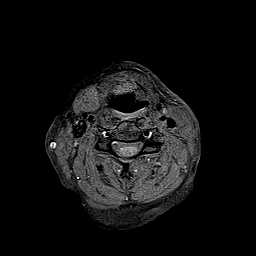
[im 22/26]
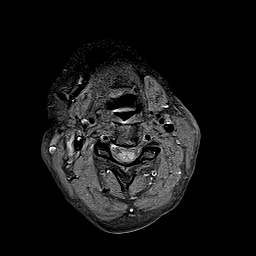

[29 of 48 positions shown; findings below may reference images not displayed]

FINDINGS: Small FLAIR bright, T2 bright lesion at C2-3 level measuring a proximally 7.5 mm in length is noted again similar in appearance to the previous finding on 09/06/2019.  There are no additional cervical spinal cord lesions noted. No abnormal enhancement is noted on post contrast study. 

At C5-6 level, significant degenerative disc disease with bulging annulus and osteophyte complex is noted causing moderately significant compromise of thecal sac and significant compromise of right neural foramina. AP diameter of thecal sac in the midline measures 6.6 mm. 

At C6-7 level, significant degenerative disc disease with bulging annulus and osteophyte complex is noted causing moderate compromise of thecal sac with AP diameter in the midline measuring 6.6 mm. Significant compromise of left lateral recess and neural foramina are noted at this level.

Paravertebral soft tissues are unremarkable.
IMPRESSION: 1. Small FLAIR bright, T2 bright lesion at C2-3 level measuring a proximally 7.5 mm in length is noted again similar in appearance to the previous finding on 09/06/2019.  There are no additional cervical spinal cord lesions noted. No abnormal enhancement is noted on post contrast study. 

2. Significant degenerative disc changes at C5-6 and C6-7 levels as described above. Findings at these two  levels are slightly more prominent compared with prior findings on 09/06/2019.

Electronically Signed by SONNYBOY, BILE BANDA at [id number] [DATE]
